# Patient Record
Sex: Female | Born: 1945 | Hispanic: No | Marital: Married | Smoking: Never smoker
Health system: Southern US, Community
[De-identification: ages and names within clinical notes are randomized; demographics above are authoritative.]

## PROBLEM LIST (undated history)

## (undated) DIAGNOSIS — M1990 Unspecified osteoarthritis, unspecified site: Secondary | ICD-10-CM

## (undated) DIAGNOSIS — Z972 Presence of dental prosthetic device (complete) (partial): Secondary | ICD-10-CM

## (undated) DIAGNOSIS — M1712 Unilateral primary osteoarthritis, left knee: Secondary | ICD-10-CM

## (undated) DIAGNOSIS — E785 Hyperlipidemia, unspecified: Secondary | ICD-10-CM

## (undated) DIAGNOSIS — Z973 Presence of spectacles and contact lenses: Secondary | ICD-10-CM

## (undated) DIAGNOSIS — K219 Gastro-esophageal reflux disease without esophagitis: Secondary | ICD-10-CM

## (undated) DIAGNOSIS — I10 Essential (primary) hypertension: Secondary | ICD-10-CM

## (undated) DIAGNOSIS — E119 Type 2 diabetes mellitus without complications: Secondary | ICD-10-CM

## (undated) HISTORY — PX: TUBAL LIGATION: SHX77

## (undated) HISTORY — PX: DILATION AND CURETTAGE OF UTERUS: SHX78

## (undated) HISTORY — PX: COLONOSCOPY: SHX174

---

## 1999-09-13 ENCOUNTER — Other Ambulatory Visit: Admission: RE | Admit: 1999-09-13 | Discharge: 1999-09-13 | Payer: Self-pay | Admitting: Obstetrics and Gynecology

## 2002-01-03 ENCOUNTER — Ambulatory Visit (HOSPITAL_COMMUNITY): Admission: RE | Admit: 2002-01-03 | Discharge: 2002-01-03 | Payer: Self-pay | Admitting: Gastroenterology

## 2002-10-10 ENCOUNTER — Other Ambulatory Visit: Admission: RE | Admit: 2002-10-10 | Discharge: 2002-10-10 | Payer: Self-pay | Admitting: Obstetrics and Gynecology

## 2003-11-15 ENCOUNTER — Other Ambulatory Visit: Admission: RE | Admit: 2003-11-15 | Discharge: 2003-11-15 | Payer: Self-pay | Admitting: Obstetrics and Gynecology

## 2004-11-29 ENCOUNTER — Other Ambulatory Visit: Admission: RE | Admit: 2004-11-29 | Discharge: 2004-11-29 | Payer: Self-pay | Admitting: Obstetrics and Gynecology

## 2005-07-02 ENCOUNTER — Encounter: Admission: RE | Admit: 2005-07-02 | Discharge: 2005-09-30 | Payer: Self-pay | Admitting: Family Medicine

## 2007-05-20 ENCOUNTER — Encounter: Admission: RE | Admit: 2007-05-20 | Discharge: 2007-06-16 | Payer: Self-pay | Admitting: Family Medicine

## 2007-06-17 ENCOUNTER — Encounter: Admission: RE | Admit: 2007-06-17 | Discharge: 2007-08-06 | Payer: Self-pay | Admitting: Family Medicine

## 2010-08-30 ENCOUNTER — Ambulatory Visit: Payer: 59 | Attending: Family Medicine | Admitting: Physical Therapy

## 2010-08-30 DIAGNOSIS — M25559 Pain in unspecified hip: Secondary | ICD-10-CM | POA: Insufficient documentation

## 2010-08-30 DIAGNOSIS — M6281 Muscle weakness (generalized): Secondary | ICD-10-CM | POA: Insufficient documentation

## 2010-08-30 DIAGNOSIS — R5381 Other malaise: Secondary | ICD-10-CM | POA: Insufficient documentation

## 2010-08-30 DIAGNOSIS — IMO0001 Reserved for inherently not codable concepts without codable children: Secondary | ICD-10-CM | POA: Insufficient documentation

## 2010-09-03 ENCOUNTER — Ambulatory Visit: Payer: 59 | Admitting: Physical Therapy

## 2010-09-05 ENCOUNTER — Ambulatory Visit: Payer: 59 | Admitting: Physical Therapy

## 2010-09-13 ENCOUNTER — Encounter: Payer: 59 | Admitting: Physical Therapy

## 2010-09-26 ENCOUNTER — Ambulatory Visit: Payer: 59 | Attending: Family Medicine | Admitting: Physical Therapy

## 2010-09-26 DIAGNOSIS — M6281 Muscle weakness (generalized): Secondary | ICD-10-CM | POA: Insufficient documentation

## 2010-09-26 DIAGNOSIS — M25559 Pain in unspecified hip: Secondary | ICD-10-CM | POA: Insufficient documentation

## 2010-09-26 DIAGNOSIS — IMO0001 Reserved for inherently not codable concepts without codable children: Secondary | ICD-10-CM | POA: Insufficient documentation

## 2010-09-26 DIAGNOSIS — R5381 Other malaise: Secondary | ICD-10-CM | POA: Insufficient documentation

## 2010-11-01 NOTE — Op Note (Signed)
[facility]  Patient:    Brooke Villa, Brooke Villa Visit Number: [id number] MRN: [id number]          Service Type: END Location: ENDO Attending Physician:  Louie Bun Dictated by:   Everardo All Madilyn Fireman, M.D. Proc. Date: 01/03/02 Admit Date:  01/03/2002 Discharge Date: 01/03/2002   CC:         Jamesetta Geralds, M.D.   Operative Report  PROCEDURE:  Colonoscopy.  INDICATIONS FOR PROCEDURE:  Intermittent rectal bleeding in a 65 year old patient with no prior colon screening.  PROCEDURE:  The patient was placed in the left lateral decubitus position and placed on the pulse monitor with continuous low-flow oxygen delivered by nasal cannula. She was sedated with 75 mg IV Demerol and 6 mg IV Versed. The Olympus video colonoscope is inserted into the rectum and advanced to the cecum, confirmed by transillumination of McBurneys point and visualization of the ileocecal valve and appendiceal orifice. The prep was excellent. The cecum, ascending, transverse, descending, and sigmoid colon all appeared normal with no masses, polyps, diverticula, or other mucosal abnormalities. The rectum, likewise, appeared normal. Retroflex view of the anus revealed small internal hemorrhoids. The colonoscope is then withdrawn and the patient returned to the recovery room in stable condition. She tolerated the procedure well and there were no immediate complications.  IMPRESSION:  Small internal hemorrhoids, otherwise, normal colonoscopy.  PLAN: 1. Treat hemorrhoids symptomatically. 2. Colon screening by sigmoidoscopy in approximately five years. Dictated by:   Everardo All Madilyn Fireman, M.D. Attending Physician:  Louie Bun DD:  01/03/02 TD:  01/06/02 Job: [id number]

## 2011-01-03 ENCOUNTER — Other Ambulatory Visit: Payer: Self-pay | Admitting: Family Medicine

## 2011-01-03 ENCOUNTER — Ambulatory Visit
Admission: RE | Admit: 2011-01-03 | Discharge: 2011-01-03 | Disposition: A | Discharge status: Home / Self Care | Payer: 59 | Source: Ambulatory Visit | Attending: Family Medicine | Admitting: Family Medicine

## 2011-01-03 DIAGNOSIS — R609 Edema, unspecified: Secondary | ICD-10-CM

## 2011-01-03 IMAGING — US US EXTREM LOW VENOUS*R*
1 series · 14 of 24 positions shown · non-contrast
Comparison: None

CLINICAL DATA: Right leg pain and swelling.

RIGHT LOWER EXTREMITY VENOUS DUPLEX ULTRASOUND
TECHNIQUE: Gray-scale sonography with graded compression, as well
as color Doppler and duplex ultrasound, were performed to evaluate
the deep venous system of the lower extremity from the level of the
common femoral vein through the popliteal and proximal calf veins.
Spectral Doppler was utilized to evaluate flow at rest and with
distal augmentation maneuvers.

[Series 1: us extrem low venous*right* · 14 of 24 slices shown]
[im 1/24]
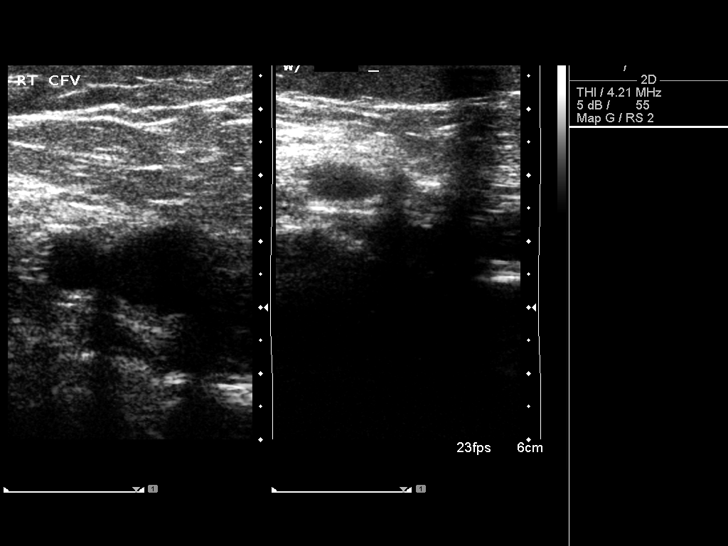
[im 3/24]
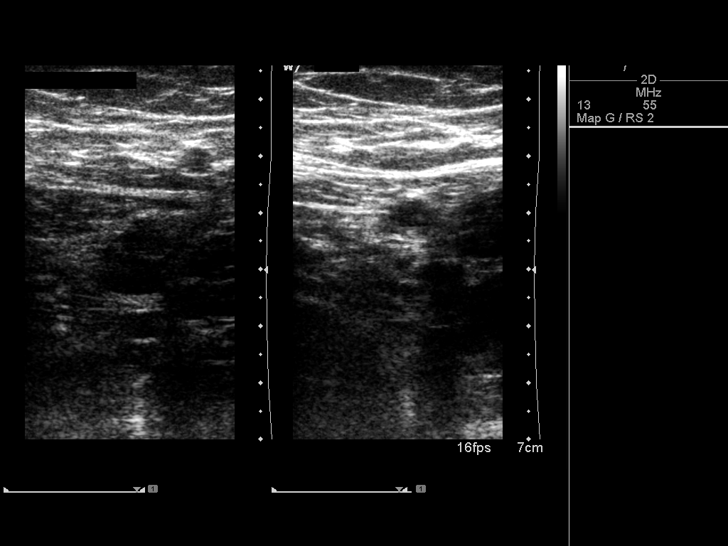
[im 5/24]
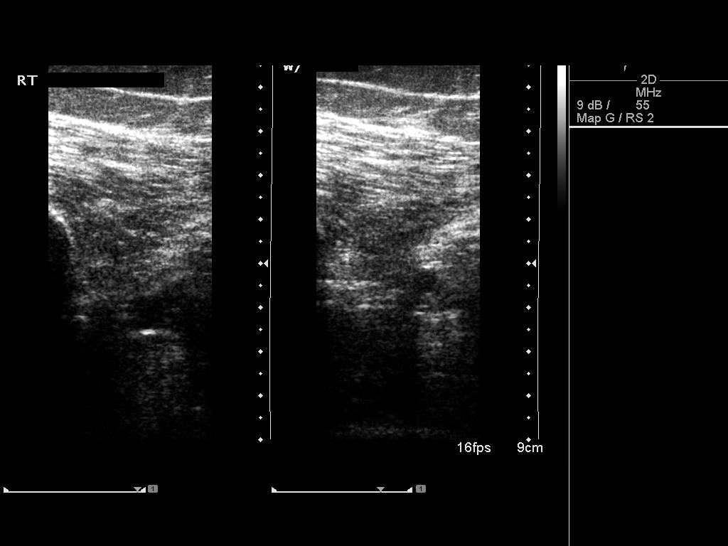
[im 7/24]
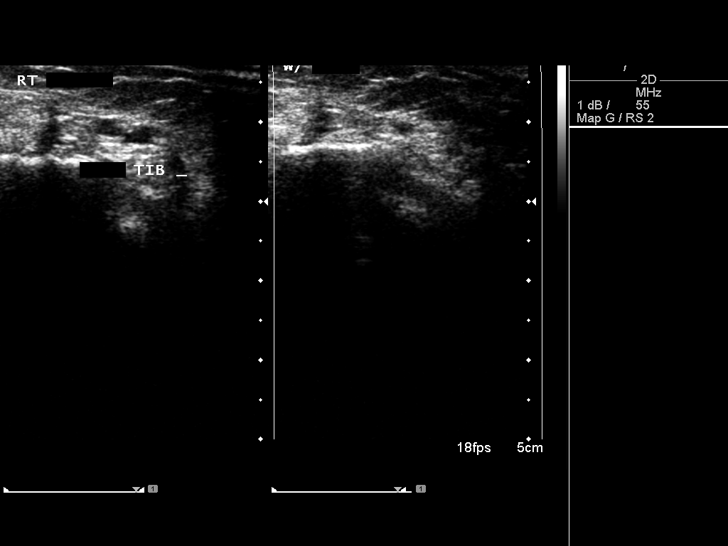
[im 8/24]
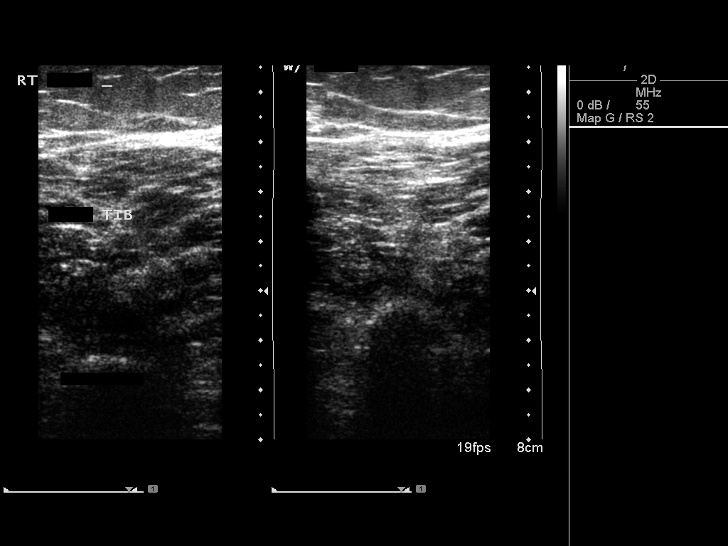
[im 10/24]
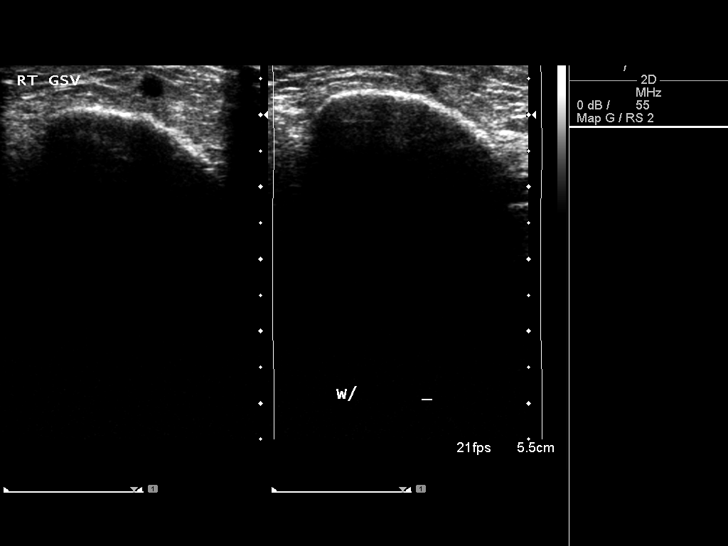
[im 12/24]
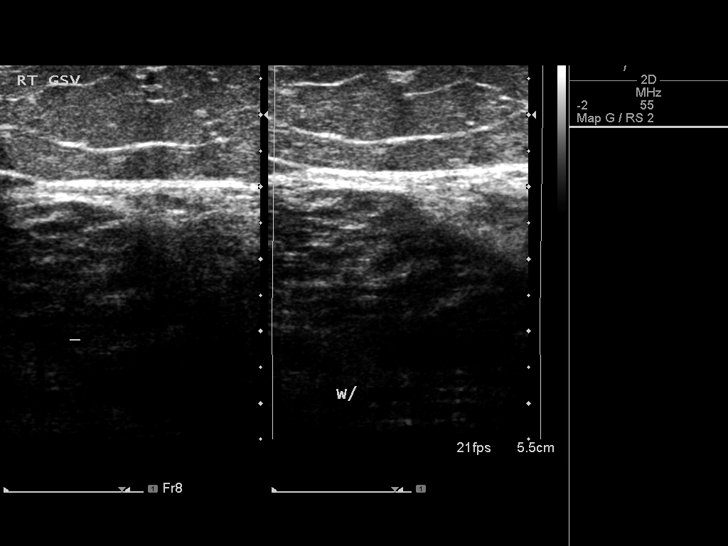
[im 13/24]
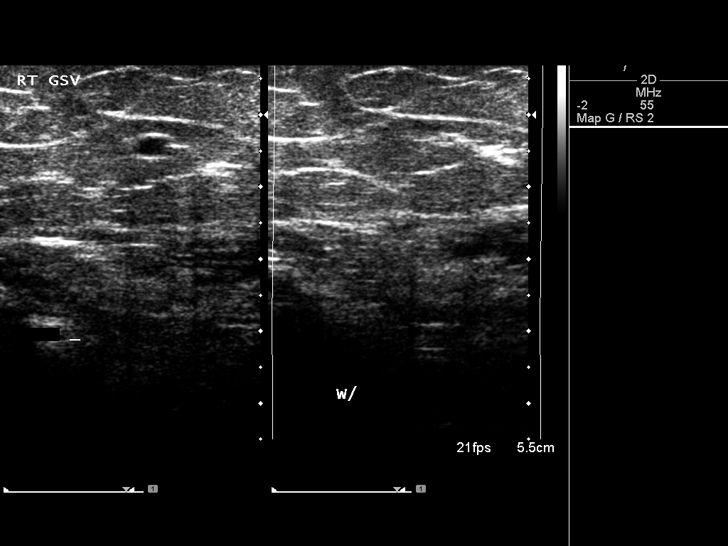
[im 15/24]
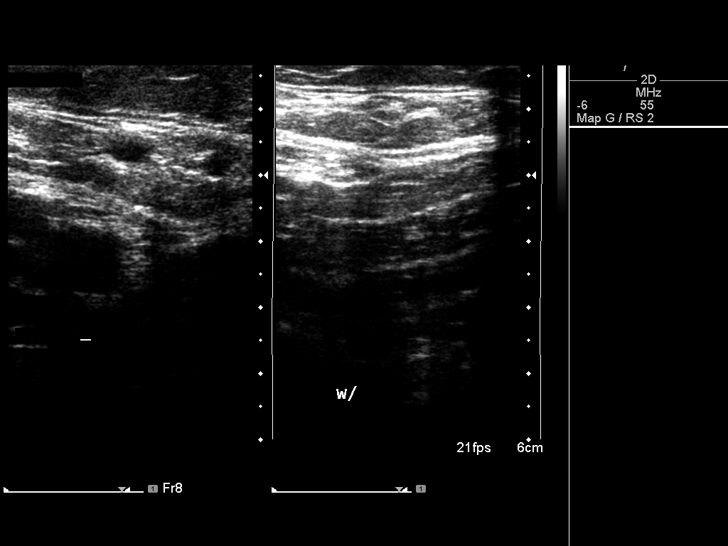
[im 17/24]
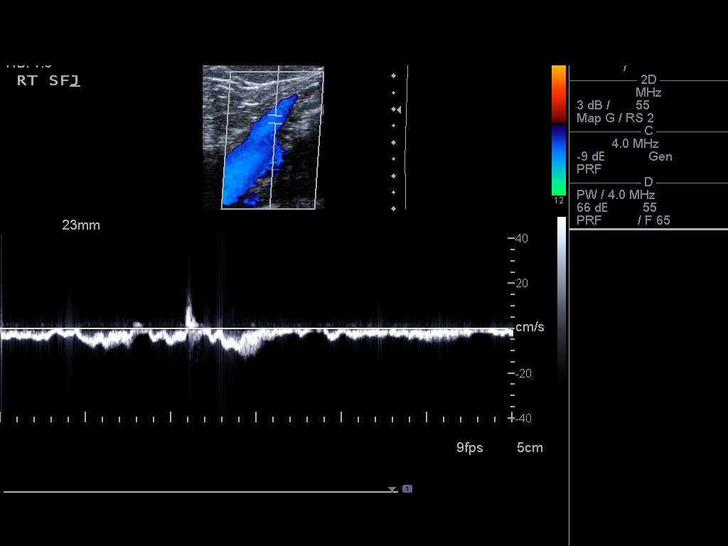
[im 19/24]
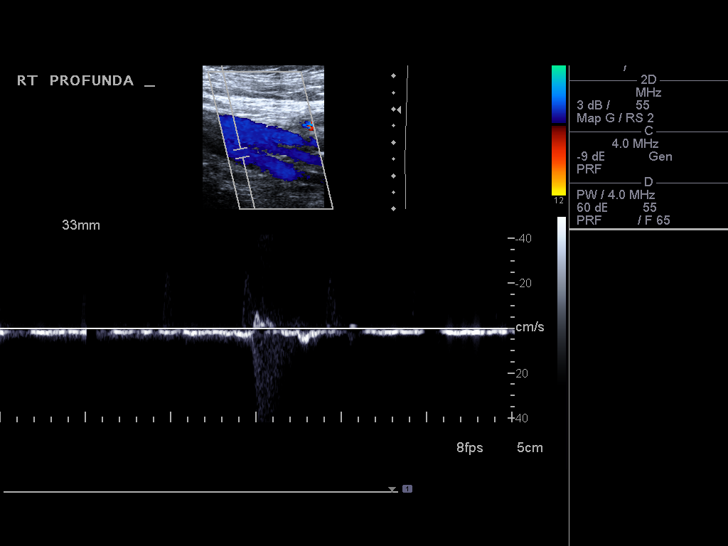
[im 20/24]
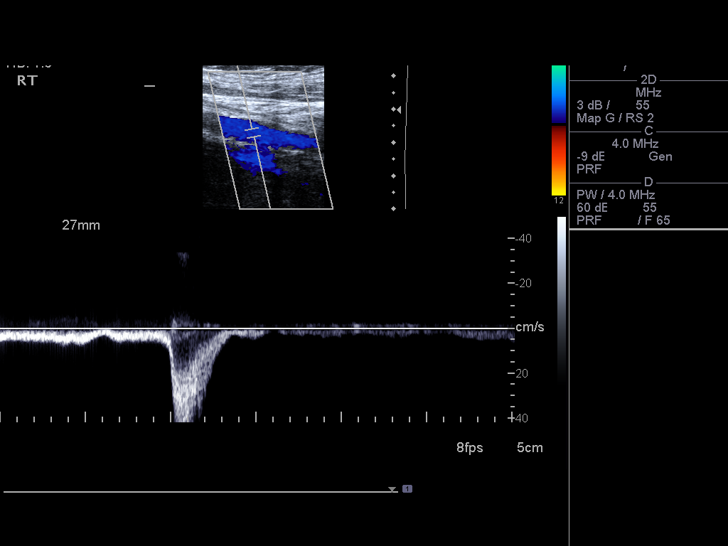
[im 22/24]
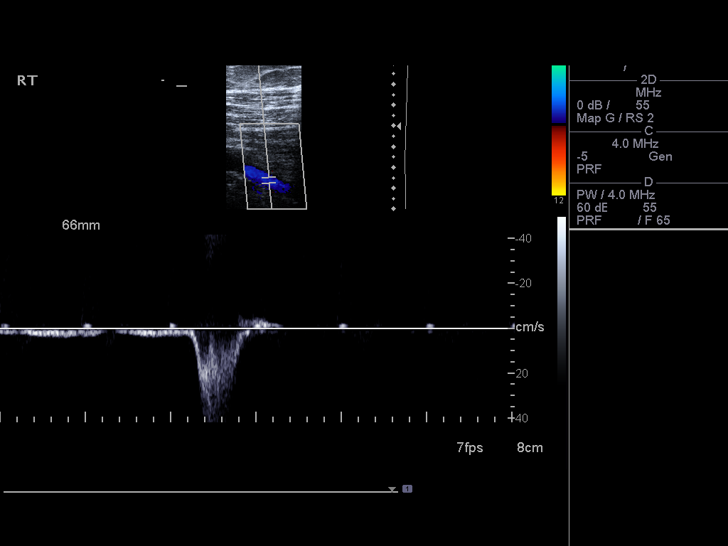
[im 24/24]
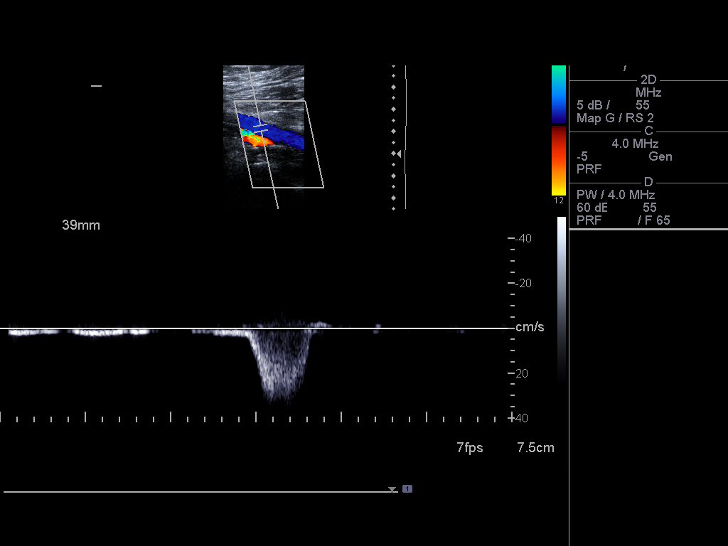

[14 of 24 positions shown; findings below may reference images not displayed]

FINDINGS: There is patent flow in the common femoral, profunda
femoral, superficial femoral and popliteal veins.  These vessels
were completely compressible and demonstrate increased flow with
augmentation.

No findings for superficial thrombophlebitis.
IMPRESSION: Negative venous Doppler examination for deep venous thrombosis in
the right lower extremity.

## 2011-06-25 DIAGNOSIS — M1710 Unilateral primary osteoarthritis, unspecified knee: Secondary | ICD-10-CM | POA: Diagnosis not present

## 2011-06-26 DIAGNOSIS — R262 Difficulty in walking, not elsewhere classified: Secondary | ICD-10-CM | POA: Diagnosis not present

## 2011-06-26 DIAGNOSIS — M6281 Muscle weakness (generalized): Secondary | ICD-10-CM | POA: Diagnosis not present

## 2011-06-26 DIAGNOSIS — R300 Dysuria: Secondary | ICD-10-CM | POA: Diagnosis not present

## 2011-06-26 DIAGNOSIS — M25569 Pain in unspecified knee: Secondary | ICD-10-CM | POA: Diagnosis not present

## 2011-06-27 DIAGNOSIS — M25569 Pain in unspecified knee: Secondary | ICD-10-CM | POA: Diagnosis not present

## 2011-06-27 DIAGNOSIS — M6281 Muscle weakness (generalized): Secondary | ICD-10-CM | POA: Diagnosis not present

## 2011-06-27 DIAGNOSIS — R262 Difficulty in walking, not elsewhere classified: Secondary | ICD-10-CM | POA: Diagnosis not present

## 2011-07-18 DIAGNOSIS — E119 Type 2 diabetes mellitus without complications: Secondary | ICD-10-CM | POA: Diagnosis not present

## 2011-07-18 DIAGNOSIS — I10 Essential (primary) hypertension: Secondary | ICD-10-CM | POA: Diagnosis not present

## 2011-07-18 DIAGNOSIS — M76899 Other specified enthesopathies of unspecified lower limb, excluding foot: Secondary | ICD-10-CM | POA: Diagnosis not present

## 2011-07-18 DIAGNOSIS — M109 Gout, unspecified: Secondary | ICD-10-CM | POA: Diagnosis not present

## 2011-07-18 DIAGNOSIS — R262 Difficulty in walking, not elsewhere classified: Secondary | ICD-10-CM | POA: Diagnosis not present

## 2011-07-18 DIAGNOSIS — Z8719 Personal history of other diseases of the digestive system: Secondary | ICD-10-CM | POA: Diagnosis not present

## 2011-07-18 DIAGNOSIS — M6281 Muscle weakness (generalized): Secondary | ICD-10-CM | POA: Diagnosis not present

## 2011-07-18 DIAGNOSIS — E782 Mixed hyperlipidemia: Secondary | ICD-10-CM | POA: Diagnosis not present

## 2011-07-18 DIAGNOSIS — R252 Cramp and spasm: Secondary | ICD-10-CM | POA: Diagnosis not present

## 2011-07-18 DIAGNOSIS — M25569 Pain in unspecified knee: Secondary | ICD-10-CM | POA: Diagnosis not present

## 2011-07-21 DIAGNOSIS — M545 Low back pain, unspecified: Secondary | ICD-10-CM | POA: Diagnosis not present

## 2011-07-21 DIAGNOSIS — IMO0002 Reserved for concepts with insufficient information to code with codable children: Secondary | ICD-10-CM | POA: Diagnosis not present

## 2011-09-03 DIAGNOSIS — IMO0002 Reserved for concepts with insufficient information to code with codable children: Secondary | ICD-10-CM | POA: Diagnosis not present

## 2011-09-03 DIAGNOSIS — M545 Low back pain, unspecified: Secondary | ICD-10-CM | POA: Diagnosis not present

## 2011-10-15 DIAGNOSIS — IMO0002 Reserved for concepts with insufficient information to code with codable children: Secondary | ICD-10-CM | POA: Diagnosis not present

## 2012-01-09 DIAGNOSIS — Z1211 Encounter for screening for malignant neoplasm of colon: Secondary | ICD-10-CM | POA: Diagnosis not present

## 2012-01-13 DIAGNOSIS — Z8719 Personal history of other diseases of the digestive system: Secondary | ICD-10-CM | POA: Diagnosis not present

## 2012-01-13 DIAGNOSIS — I10 Essential (primary) hypertension: Secondary | ICD-10-CM | POA: Diagnosis not present

## 2012-01-13 DIAGNOSIS — E782 Mixed hyperlipidemia: Secondary | ICD-10-CM | POA: Diagnosis not present

## 2012-01-13 DIAGNOSIS — E119 Type 2 diabetes mellitus without complications: Secondary | ICD-10-CM | POA: Diagnosis not present

## 2012-01-13 DIAGNOSIS — R252 Cramp and spasm: Secondary | ICD-10-CM | POA: Diagnosis not present

## 2012-01-13 DIAGNOSIS — M109 Gout, unspecified: Secondary | ICD-10-CM | POA: Diagnosis not present

## 2012-01-16 DIAGNOSIS — Z780 Asymptomatic menopausal state: Secondary | ICD-10-CM | POA: Diagnosis not present

## 2012-01-16 DIAGNOSIS — E119 Type 2 diabetes mellitus without complications: Secondary | ICD-10-CM | POA: Diagnosis not present

## 2012-01-16 DIAGNOSIS — E782 Mixed hyperlipidemia: Secondary | ICD-10-CM | POA: Diagnosis not present

## 2012-01-16 DIAGNOSIS — M109 Gout, unspecified: Secondary | ICD-10-CM | POA: Diagnosis not present

## 2012-01-16 DIAGNOSIS — I10 Essential (primary) hypertension: Secondary | ICD-10-CM | POA: Diagnosis not present

## 2012-01-16 DIAGNOSIS — Z8719 Personal history of other diseases of the digestive system: Secondary | ICD-10-CM | POA: Diagnosis not present

## 2012-01-16 DIAGNOSIS — R609 Edema, unspecified: Secondary | ICD-10-CM | POA: Diagnosis not present

## 2012-01-16 DIAGNOSIS — M25569 Pain in unspecified knee: Secondary | ICD-10-CM | POA: Diagnosis not present

## 2012-01-23 DIAGNOSIS — H538 Other visual disturbances: Secondary | ICD-10-CM | POA: Diagnosis not present

## 2012-01-23 DIAGNOSIS — H2510 Age-related nuclear cataract, unspecified eye: Secondary | ICD-10-CM | POA: Diagnosis not present

## 2012-01-23 DIAGNOSIS — H35379 Puckering of macula, unspecified eye: Secondary | ICD-10-CM | POA: Diagnosis not present

## 2012-01-23 DIAGNOSIS — H524 Presbyopia: Secondary | ICD-10-CM | POA: Diagnosis not present

## 2012-01-23 DIAGNOSIS — H43819 Vitreous degeneration, unspecified eye: Secondary | ICD-10-CM | POA: Diagnosis not present

## 2012-03-26 DIAGNOSIS — Z1231 Encounter for screening mammogram for malignant neoplasm of breast: Secondary | ICD-10-CM | POA: Diagnosis not present

## 2012-03-26 DIAGNOSIS — Z1289 Encounter for screening for malignant neoplasm of other sites: Secondary | ICD-10-CM | POA: Diagnosis not present

## 2012-03-26 DIAGNOSIS — N760 Acute vaginitis: Secondary | ICD-10-CM | POA: Diagnosis not present

## 2012-05-20 DIAGNOSIS — Z23 Encounter for immunization: Secondary | ICD-10-CM | POA: Diagnosis not present

## 2012-07-19 DIAGNOSIS — I10 Essential (primary) hypertension: Secondary | ICD-10-CM | POA: Diagnosis not present

## 2012-07-19 DIAGNOSIS — R252 Cramp and spasm: Secondary | ICD-10-CM | POA: Diagnosis not present

## 2012-07-19 DIAGNOSIS — E782 Mixed hyperlipidemia: Secondary | ICD-10-CM | POA: Diagnosis not present

## 2012-07-19 DIAGNOSIS — M109 Gout, unspecified: Secondary | ICD-10-CM | POA: Diagnosis not present

## 2012-07-19 DIAGNOSIS — M25569 Pain in unspecified knee: Secondary | ICD-10-CM | POA: Diagnosis not present

## 2012-07-19 DIAGNOSIS — E119 Type 2 diabetes mellitus without complications: Secondary | ICD-10-CM | POA: Diagnosis not present

## 2012-07-19 DIAGNOSIS — Z780 Asymptomatic menopausal state: Secondary | ICD-10-CM | POA: Diagnosis not present

## 2012-07-19 DIAGNOSIS — R609 Edema, unspecified: Secondary | ICD-10-CM | POA: Diagnosis not present

## 2012-07-23 DIAGNOSIS — I10 Essential (primary) hypertension: Secondary | ICD-10-CM | POA: Diagnosis not present

## 2012-07-23 DIAGNOSIS — H5710 Ocular pain, unspecified eye: Secondary | ICD-10-CM | POA: Diagnosis not present

## 2012-07-23 DIAGNOSIS — M109 Gout, unspecified: Secondary | ICD-10-CM | POA: Diagnosis not present

## 2012-07-23 DIAGNOSIS — IMO0001 Reserved for inherently not codable concepts without codable children: Secondary | ICD-10-CM | POA: Diagnosis not present

## 2012-07-23 DIAGNOSIS — E782 Mixed hyperlipidemia: Secondary | ICD-10-CM | POA: Diagnosis not present

## 2012-07-23 DIAGNOSIS — H04129 Dry eye syndrome of unspecified lacrimal gland: Secondary | ICD-10-CM | POA: Diagnosis not present

## 2012-07-23 DIAGNOSIS — R809 Proteinuria, unspecified: Secondary | ICD-10-CM | POA: Diagnosis not present

## 2012-07-23 DIAGNOSIS — Z1331 Encounter for screening for depression: Secondary | ICD-10-CM | POA: Diagnosis not present

## 2012-07-23 DIAGNOSIS — R609 Edema, unspecified: Secondary | ICD-10-CM | POA: Diagnosis not present

## 2012-08-06 DIAGNOSIS — H5710 Ocular pain, unspecified eye: Secondary | ICD-10-CM | POA: Diagnosis not present

## 2012-08-06 DIAGNOSIS — H04129 Dry eye syndrome of unspecified lacrimal gland: Secondary | ICD-10-CM | POA: Diagnosis not present

## 2012-09-03 DIAGNOSIS — H5710 Ocular pain, unspecified eye: Secondary | ICD-10-CM | POA: Diagnosis not present

## 2012-09-03 DIAGNOSIS — H04129 Dry eye syndrome of unspecified lacrimal gland: Secondary | ICD-10-CM | POA: Diagnosis not present

## 2013-01-17 DIAGNOSIS — IMO0001 Reserved for inherently not codable concepts without codable children: Secondary | ICD-10-CM | POA: Diagnosis not present

## 2013-01-17 DIAGNOSIS — I10 Essential (primary) hypertension: Secondary | ICD-10-CM | POA: Diagnosis not present

## 2013-01-17 DIAGNOSIS — E782 Mixed hyperlipidemia: Secondary | ICD-10-CM | POA: Diagnosis not present

## 2013-01-17 DIAGNOSIS — R609 Edema, unspecified: Secondary | ICD-10-CM | POA: Diagnosis not present

## 2013-01-17 DIAGNOSIS — H5710 Ocular pain, unspecified eye: Secondary | ICD-10-CM | POA: Diagnosis not present

## 2013-01-17 DIAGNOSIS — R809 Proteinuria, unspecified: Secondary | ICD-10-CM | POA: Diagnosis not present

## 2013-01-17 DIAGNOSIS — M109 Gout, unspecified: Secondary | ICD-10-CM | POA: Diagnosis not present

## 2013-03-04 DIAGNOSIS — H25049 Posterior subcapsular polar age-related cataract, unspecified eye: Secondary | ICD-10-CM | POA: Diagnosis not present

## 2013-03-04 DIAGNOSIS — E119 Type 2 diabetes mellitus without complications: Secondary | ICD-10-CM | POA: Diagnosis not present

## 2013-03-04 DIAGNOSIS — Z23 Encounter for immunization: Secondary | ICD-10-CM | POA: Diagnosis not present

## 2013-03-04 DIAGNOSIS — M109 Gout, unspecified: Secondary | ICD-10-CM | POA: Diagnosis not present

## 2013-03-04 DIAGNOSIS — H04129 Dry eye syndrome of unspecified lacrimal gland: Secondary | ICD-10-CM | POA: Diagnosis not present

## 2013-03-04 DIAGNOSIS — G609 Hereditary and idiopathic neuropathy, unspecified: Secondary | ICD-10-CM | POA: Diagnosis not present

## 2013-03-04 DIAGNOSIS — E782 Mixed hyperlipidemia: Secondary | ICD-10-CM | POA: Diagnosis not present

## 2013-03-04 DIAGNOSIS — H538 Other visual disturbances: Secondary | ICD-10-CM | POA: Diagnosis not present

## 2013-03-04 DIAGNOSIS — M25569 Pain in unspecified knee: Secondary | ICD-10-CM | POA: Diagnosis not present

## 2013-03-04 DIAGNOSIS — H524 Presbyopia: Secondary | ICD-10-CM | POA: Diagnosis not present

## 2013-03-04 DIAGNOSIS — I10 Essential (primary) hypertension: Secondary | ICD-10-CM | POA: Diagnosis not present

## 2013-03-04 DIAGNOSIS — K219 Gastro-esophageal reflux disease without esophagitis: Secondary | ICD-10-CM | POA: Diagnosis not present

## 2013-03-14 DIAGNOSIS — M1710 Unilateral primary osteoarthritis, unspecified knee: Secondary | ICD-10-CM | POA: Diagnosis not present

## 2013-03-14 DIAGNOSIS — M76899 Other specified enthesopathies of unspecified lower limb, excluding foot: Secondary | ICD-10-CM | POA: Diagnosis not present

## 2013-04-04 ENCOUNTER — Other Ambulatory Visit: Payer: Self-pay

## 2013-04-04 DIAGNOSIS — Z1231 Encounter for screening mammogram for malignant neoplasm of breast: Secondary | ICD-10-CM

## 2013-04-06 DIAGNOSIS — M1710 Unilateral primary osteoarthritis, unspecified knee: Secondary | ICD-10-CM | POA: Diagnosis not present

## 2013-04-08 ENCOUNTER — Ambulatory Visit
Admission: RE | Admit: 2013-04-08 | Discharge: 2013-04-08 | Disposition: A | Discharge status: Home / Self Care | Payer: Medicare Other | Source: Ambulatory Visit

## 2013-04-08 DIAGNOSIS — Z1231 Encounter for screening mammogram for malignant neoplasm of breast: Secondary | ICD-10-CM

## 2013-04-21 DIAGNOSIS — N951 Menopausal and female climacteric states: Secondary | ICD-10-CM | POA: Diagnosis not present

## 2013-04-21 DIAGNOSIS — Z01419 Encounter for gynecological examination (general) (routine) without abnormal findings: Secondary | ICD-10-CM | POA: Diagnosis not present

## 2013-05-06 ENCOUNTER — Ambulatory Visit: Payer: 59

## 2013-05-09 DIAGNOSIS — M1710 Unilateral primary osteoarthritis, unspecified knee: Secondary | ICD-10-CM | POA: Diagnosis not present

## 2013-05-09 DIAGNOSIS — IMO0002 Reserved for concepts with insufficient information to code with codable children: Secondary | ICD-10-CM | POA: Diagnosis not present

## 2013-07-12 DIAGNOSIS — R059 Cough, unspecified: Secondary | ICD-10-CM | POA: Diagnosis not present

## 2013-07-12 DIAGNOSIS — R05 Cough: Secondary | ICD-10-CM | POA: Diagnosis not present

## 2013-08-12 DIAGNOSIS — R05 Cough: Secondary | ICD-10-CM | POA: Diagnosis not present

## 2013-08-12 DIAGNOSIS — K219 Gastro-esophageal reflux disease without esophagitis: Secondary | ICD-10-CM | POA: Diagnosis not present

## 2013-08-12 DIAGNOSIS — R059 Cough, unspecified: Secondary | ICD-10-CM | POA: Diagnosis not present

## 2013-08-25 ENCOUNTER — Other Ambulatory Visit: Payer: Self-pay | Admitting: Orthopedic Surgery

## 2013-08-26 DIAGNOSIS — Z1331 Encounter for screening for depression: Secondary | ICD-10-CM | POA: Diagnosis not present

## 2013-08-26 DIAGNOSIS — M109 Gout, unspecified: Secondary | ICD-10-CM | POA: Diagnosis not present

## 2013-08-26 DIAGNOSIS — I10 Essential (primary) hypertension: Secondary | ICD-10-CM | POA: Diagnosis not present

## 2013-08-26 DIAGNOSIS — IMO0001 Reserved for inherently not codable concepts without codable children: Secondary | ICD-10-CM | POA: Diagnosis not present

## 2013-08-26 DIAGNOSIS — R609 Edema, unspecified: Secondary | ICD-10-CM | POA: Diagnosis not present

## 2013-08-26 DIAGNOSIS — E782 Mixed hyperlipidemia: Secondary | ICD-10-CM | POA: Diagnosis not present

## 2013-08-26 DIAGNOSIS — K219 Gastro-esophageal reflux disease without esophagitis: Secondary | ICD-10-CM | POA: Diagnosis not present

## 2013-08-26 DIAGNOSIS — M25569 Pain in unspecified knee: Secondary | ICD-10-CM | POA: Diagnosis not present

## 2013-09-20 ENCOUNTER — Inpatient Hospital Stay: Admit: 2013-09-20 | Payer: Self-pay | Admitting: Orthopedic Surgery

## 2013-09-20 SURGERY — ARTHROPLASTY, KNEE, UNICOMPARTMENTAL
Anesthesia: General | Site: Knee | Laterality: Right

## 2013-10-28 DIAGNOSIS — M25569 Pain in unspecified knee: Secondary | ICD-10-CM | POA: Diagnosis not present

## 2013-10-28 DIAGNOSIS — M1710 Unilateral primary osteoarthritis, unspecified knee: Secondary | ICD-10-CM | POA: Diagnosis not present

## 2013-11-11 DIAGNOSIS — M47817 Spondylosis without myelopathy or radiculopathy, lumbosacral region: Secondary | ICD-10-CM | POA: Diagnosis not present

## 2013-11-11 DIAGNOSIS — M1710 Unilateral primary osteoarthritis, unspecified knee: Secondary | ICD-10-CM | POA: Diagnosis not present

## 2013-11-25 DIAGNOSIS — M25569 Pain in unspecified knee: Secondary | ICD-10-CM | POA: Diagnosis not present

## 2013-11-25 DIAGNOSIS — M1710 Unilateral primary osteoarthritis, unspecified knee: Secondary | ICD-10-CM | POA: Diagnosis not present

## 2013-12-04 DIAGNOSIS — M1710 Unilateral primary osteoarthritis, unspecified knee: Secondary | ICD-10-CM | POA: Diagnosis not present

## 2013-12-05 DIAGNOSIS — IMO0002 Reserved for concepts with insufficient information to code with codable children: Secondary | ICD-10-CM | POA: Diagnosis not present

## 2013-12-05 DIAGNOSIS — M1710 Unilateral primary osteoarthritis, unspecified knee: Secondary | ICD-10-CM | POA: Diagnosis not present

## 2013-12-29 DIAGNOSIS — M1710 Unilateral primary osteoarthritis, unspecified knee: Secondary | ICD-10-CM | POA: Diagnosis not present

## 2014-01-31 DIAGNOSIS — M1710 Unilateral primary osteoarthritis, unspecified knee: Secondary | ICD-10-CM | POA: Diagnosis not present

## 2014-02-09 ENCOUNTER — Other Ambulatory Visit: Payer: Self-pay | Admitting: Orthopedic Surgery

## 2014-03-03 DIAGNOSIS — H538 Other visual disturbances: Secondary | ICD-10-CM | POA: Diagnosis not present

## 2014-03-03 DIAGNOSIS — E119 Type 2 diabetes mellitus without complications: Secondary | ICD-10-CM | POA: Diagnosis not present

## 2014-03-03 DIAGNOSIS — R609 Edema, unspecified: Secondary | ICD-10-CM | POA: Diagnosis not present

## 2014-03-03 DIAGNOSIS — Z1331 Encounter for screening for depression: Secondary | ICD-10-CM | POA: Diagnosis not present

## 2014-03-03 DIAGNOSIS — H04129 Dry eye syndrome of unspecified lacrimal gland: Secondary | ICD-10-CM | POA: Diagnosis not present

## 2014-03-03 DIAGNOSIS — Z23 Encounter for immunization: Secondary | ICD-10-CM | POA: Diagnosis not present

## 2014-03-03 DIAGNOSIS — M25569 Pain in unspecified knee: Secondary | ICD-10-CM | POA: Diagnosis not present

## 2014-03-03 DIAGNOSIS — IMO0001 Reserved for inherently not codable concepts without codable children: Secondary | ICD-10-CM | POA: Diagnosis not present

## 2014-03-03 DIAGNOSIS — I10 Essential (primary) hypertension: Secondary | ICD-10-CM | POA: Diagnosis not present

## 2014-03-03 DIAGNOSIS — E782 Mixed hyperlipidemia: Secondary | ICD-10-CM | POA: Diagnosis not present

## 2014-03-03 DIAGNOSIS — M109 Gout, unspecified: Secondary | ICD-10-CM | POA: Diagnosis not present

## 2014-03-03 DIAGNOSIS — H52229 Regular astigmatism, unspecified eye: Secondary | ICD-10-CM | POA: Diagnosis not present

## 2014-03-10 DIAGNOSIS — M109 Gout, unspecified: Secondary | ICD-10-CM | POA: Diagnosis not present

## 2014-03-10 DIAGNOSIS — I10 Essential (primary) hypertension: Secondary | ICD-10-CM | POA: Diagnosis not present

## 2014-03-10 DIAGNOSIS — M25569 Pain in unspecified knee: Secondary | ICD-10-CM | POA: Diagnosis not present

## 2014-03-10 DIAGNOSIS — Z23 Encounter for immunization: Secondary | ICD-10-CM | POA: Diagnosis not present

## 2014-03-10 DIAGNOSIS — K219 Gastro-esophageal reflux disease without esophagitis: Secondary | ICD-10-CM | POA: Diagnosis not present

## 2014-03-10 DIAGNOSIS — E119 Type 2 diabetes mellitus without complications: Secondary | ICD-10-CM | POA: Diagnosis not present

## 2014-03-10 DIAGNOSIS — R609 Edema, unspecified: Secondary | ICD-10-CM | POA: Diagnosis not present

## 2014-03-10 DIAGNOSIS — E782 Mixed hyperlipidemia: Secondary | ICD-10-CM | POA: Diagnosis not present

## 2014-04-03 ENCOUNTER — Other Ambulatory Visit: Payer: Self-pay

## 2014-04-03 DIAGNOSIS — Z1231 Encounter for screening mammogram for malignant neoplasm of breast: Secondary | ICD-10-CM

## 2014-04-11 ENCOUNTER — Encounter (HOSPITAL_BASED_OUTPATIENT_CLINIC_OR_DEPARTMENT_OTHER): Payer: Self-pay | Admitting: *Deleted

## 2014-04-11 NOTE — Progress Notes (Signed)
Out of town-flying in Thursday 2pm-will come here for ekg-bmet-bring overnight bag,all meds and arrive 6am

## 2014-04-13 ENCOUNTER — Other Ambulatory Visit: Payer: Self-pay

## 2014-04-13 ENCOUNTER — Encounter (HOSPITAL_BASED_OUTPATIENT_CLINIC_OR_DEPARTMENT_OTHER)
Admission: RE | Admit: 2014-04-13 | Discharge: 2014-04-13 | Disposition: A | Discharge status: Home / Self Care | Payer: Medicare Other | Source: Ambulatory Visit | Attending: Orthopedic Surgery | Admitting: Orthopedic Surgery

## 2014-04-13 DIAGNOSIS — M179 Osteoarthritis of knee, unspecified: Secondary | ICD-10-CM | POA: Diagnosis not present

## 2014-04-13 DIAGNOSIS — K219 Gastro-esophageal reflux disease without esophagitis: Secondary | ICD-10-CM | POA: Diagnosis not present

## 2014-04-13 DIAGNOSIS — E119 Type 2 diabetes mellitus without complications: Secondary | ICD-10-CM | POA: Diagnosis not present

## 2014-04-13 DIAGNOSIS — Z79899 Other long term (current) drug therapy: Secondary | ICD-10-CM | POA: Diagnosis not present

## 2014-04-13 DIAGNOSIS — I10 Essential (primary) hypertension: Secondary | ICD-10-CM | POA: Diagnosis not present

## 2014-04-13 DIAGNOSIS — E785 Hyperlipidemia, unspecified: Secondary | ICD-10-CM | POA: Diagnosis not present

## 2014-04-13 LAB — BASIC METABOLIC PANEL
Anion gap: 12 (ref 5–15)
BUN: 12 mg/dL (ref 6–23)
CALCIUM: 10 mg/dL (ref 8.4–10.5)
CO2: 28 meq/L (ref 19–32)
CREATININE: 0.84 mg/dL (ref 0.50–1.10)
Chloride: 102 mEq/L (ref 96–112)
GFR calc non Af Amer: 70 mL/min — ABNORMAL LOW (ref >90)
GFR, EST AFRICAN AMERICAN: 81 mL/min — AB (ref >90)
Glucose, Bld: 93 mg/dL (ref 70–99)
Potassium: 4.2 mEq/L (ref 3.7–5.3)
Sodium: 142 mEq/L (ref 137–147)

## 2014-04-14 ENCOUNTER — Encounter (HOSPITAL_BASED_OUTPATIENT_CLINIC_OR_DEPARTMENT_OTHER): Payer: Medicare Other | Admitting: Certified Registered"

## 2014-04-14 ENCOUNTER — Encounter (HOSPITAL_BASED_OUTPATIENT_CLINIC_OR_DEPARTMENT_OTHER)
Admission: RE | Disposition: A | Discharge status: Home / Self Care | Payer: Self-pay | Source: Ambulatory Visit | Attending: Orthopedic Surgery

## 2014-04-14 ENCOUNTER — Ambulatory Visit (HOSPITAL_COMMUNITY): Payer: Medicare Other

## 2014-04-14 ENCOUNTER — Ambulatory Visit (HOSPITAL_BASED_OUTPATIENT_CLINIC_OR_DEPARTMENT_OTHER): Payer: Medicare Other | Admitting: Certified Registered"

## 2014-04-14 ENCOUNTER — Encounter (HOSPITAL_BASED_OUTPATIENT_CLINIC_OR_DEPARTMENT_OTHER): Payer: Self-pay | Admitting: Certified Registered"

## 2014-04-14 ENCOUNTER — Ambulatory Visit (HOSPITAL_BASED_OUTPATIENT_CLINIC_OR_DEPARTMENT_OTHER)
Admission: RE | Admit: 2014-04-14 | Discharge: 2014-04-15 | Disposition: A | Discharge status: Home / Self Care | Payer: Medicare Other | Source: Ambulatory Visit | Attending: Orthopedic Surgery | Admitting: Orthopedic Surgery

## 2014-04-14 DIAGNOSIS — K219 Gastro-esophageal reflux disease without esophagitis: Secondary | ICD-10-CM | POA: Insufficient documentation

## 2014-04-14 DIAGNOSIS — Z96652 Presence of left artificial knee joint: Secondary | ICD-10-CM | POA: Diagnosis not present

## 2014-04-14 DIAGNOSIS — M25562 Pain in left knee: Secondary | ICD-10-CM | POA: Diagnosis not present

## 2014-04-14 DIAGNOSIS — E785 Hyperlipidemia, unspecified: Secondary | ICD-10-CM | POA: Insufficient documentation

## 2014-04-14 DIAGNOSIS — I10 Essential (primary) hypertension: Secondary | ICD-10-CM | POA: Diagnosis not present

## 2014-04-14 DIAGNOSIS — M1712 Unilateral primary osteoarthritis, left knee: Secondary | ICD-10-CM | POA: Diagnosis not present

## 2014-04-14 DIAGNOSIS — M179 Osteoarthritis of knee, unspecified: Secondary | ICD-10-CM | POA: Insufficient documentation

## 2014-04-14 DIAGNOSIS — E119 Type 2 diabetes mellitus without complications: Secondary | ICD-10-CM | POA: Diagnosis not present

## 2014-04-14 DIAGNOSIS — G8918 Other acute postprocedural pain: Secondary | ICD-10-CM | POA: Diagnosis not present

## 2014-04-14 DIAGNOSIS — Z79899 Other long term (current) drug therapy: Secondary | ICD-10-CM | POA: Diagnosis not present

## 2014-04-14 HISTORY — DX: Gastro-esophageal reflux disease without esophagitis: K21.9

## 2014-04-14 HISTORY — DX: Essential (primary) hypertension: I10

## 2014-04-14 HISTORY — DX: Type 2 diabetes mellitus without complications: E11.9

## 2014-04-14 HISTORY — DX: Unilateral primary osteoarthritis, left knee: M17.12

## 2014-04-14 HISTORY — DX: Presence of spectacles and contact lenses: Z97.3

## 2014-04-14 HISTORY — DX: Presence of dental prosthetic device (complete) (partial): Z97.2

## 2014-04-14 HISTORY — PX: PARTIAL KNEE ARTHROPLASTY: SHX2174

## 2014-04-14 HISTORY — DX: Hyperlipidemia, unspecified: E78.5

## 2014-04-14 HISTORY — DX: Unspecified osteoarthritis, unspecified site: M19.90

## 2014-04-14 LAB — POCT HEMOGLOBIN-HEMACUE: Hemoglobin: 12.6 g/dL (ref 12.0–15.0)

## 2014-04-14 LAB — GLUCOSE, CAPILLARY
GLUCOSE-CAPILLARY: 117 mg/dL — AB (ref 70–99)
Glucose-Capillary: 119 mg/dL — ABNORMAL HIGH (ref 70–99)

## 2014-04-14 IMAGING — CR DG KNEE 1-2V PORT*L*
2 series · 2 of 2 positions shown · non-contrast
Comparison: None.

CLINICAL DATA: Status post left unicompartmental knee replacement.

EXAM:
PORTABLE LEFT KNEE - 1-2 VIEW

[ap/obl knee]
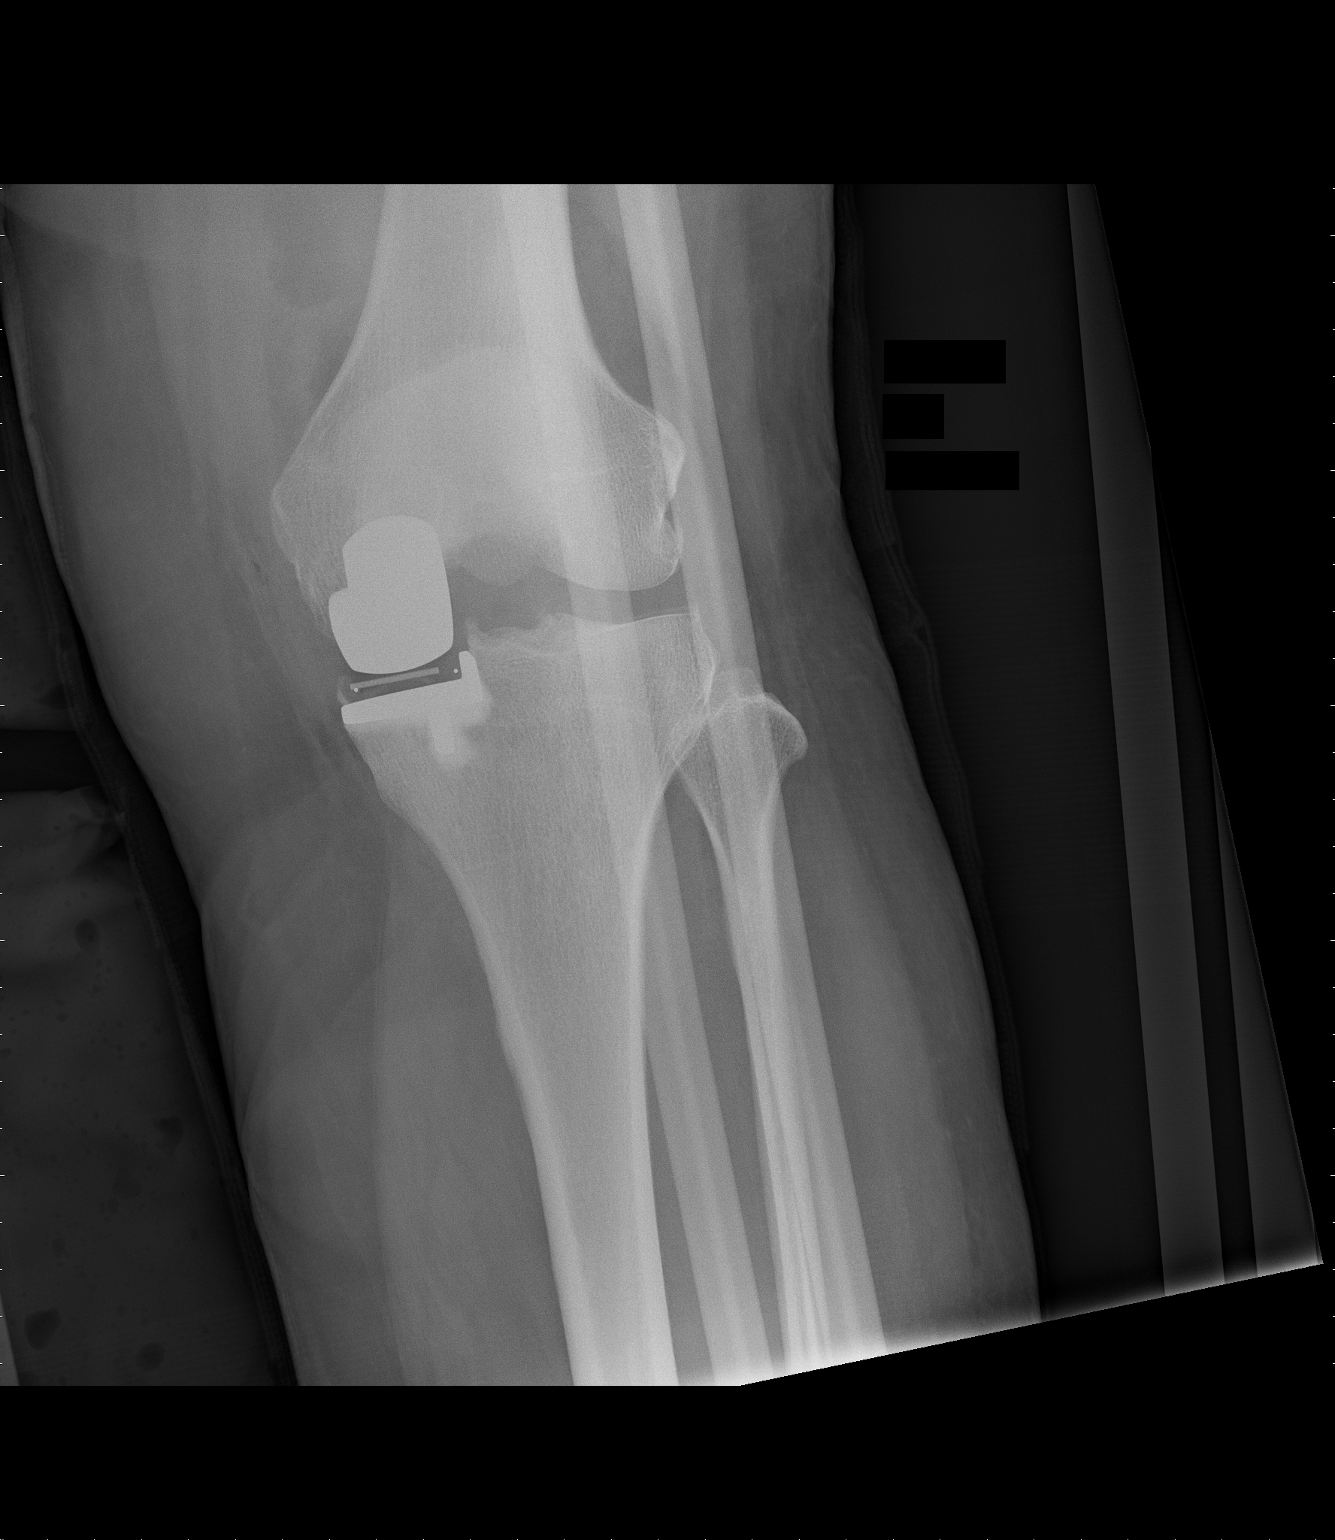

[knee lat]
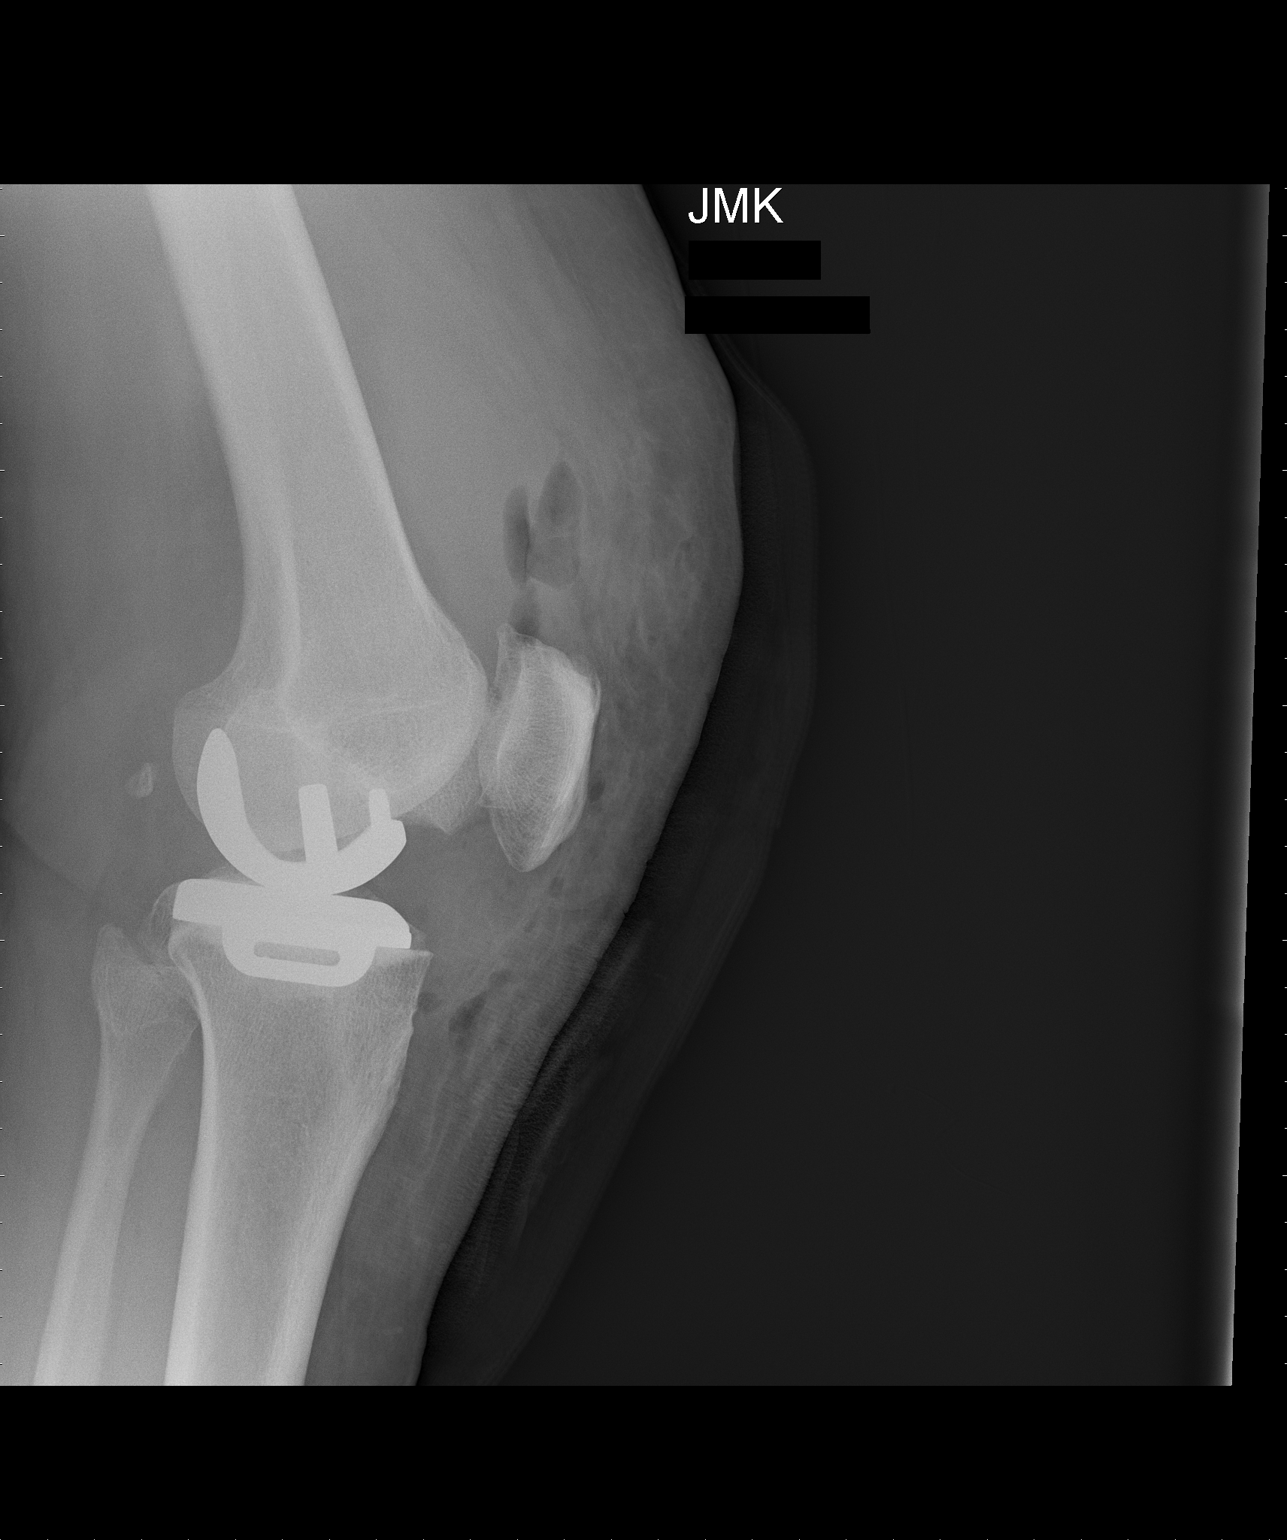

[2 of 2 positions shown; findings below may reference images not displayed]

FINDINGS: Portable radiographs of the left knee demonstrate sequelae of medial
compartment arthroplasty. The prosthetic components are
approximated. No acute fracture or dislocation is seen. There is a
knee joint effusion. Postoperative soft tissue gas and swelling are
noted.
IMPRESSION: Left knee unicompartmental arthroplasty as above.

## 2014-04-14 SURGERY — ARTHROPLASTY, KNEE, UNICOMPARTMENTAL
Anesthesia: Regional | Site: Knee | Laterality: Left

## 2014-04-14 MED ORDER — MAGNESIUM CITRATE PO SOLN: 1.0000 | Freq: Once | ORAL | Status: AC | PRN

## 2014-04-14 MED ORDER — RIVAROXABAN 10 MG PO TABS: 10.0000 mg | ORAL_TABLET | Freq: Every day | ORAL | Status: DC

## 2014-04-14 MED ORDER — ONDANSETRON HCL 4 MG PO TABS: 4.0000 mg | ORAL_TABLET | Freq: Three times a day (TID) | ORAL | Status: DC | PRN

## 2014-04-14 MED ORDER — DOCUSATE SODIUM 100 MG PO CAPS: 100.0000 mg | ORAL_CAPSULE | Freq: Two times a day (BID) | ORAL | Status: DC

## 2014-04-14 MED ORDER — HYDROMORPHONE HCL 1 MG/ML IJ SOLN
0.5000 mg | INTRAMUSCULAR | Status: DC | PRN
  Administered 2014-04-14 (×2): 0.5 mg via INTRAVENOUS

## 2014-04-14 MED ORDER — ONDANSETRON HCL 4 MG/2ML IJ SOLN: 4.0000 mg | Freq: Four times a day (QID) | INTRAMUSCULAR | Status: DC | PRN

## 2014-04-14 MED ORDER — ONDANSETRON HCL 4 MG PO TABS: 4.0000 mg | ORAL_TABLET | Freq: Four times a day (QID) | ORAL | Status: DC | PRN

## 2014-04-14 MED ORDER — METHOCARBAMOL 500 MG PO TABS
500.0000 mg | ORAL_TABLET | Freq: Four times a day (QID) | ORAL | Status: DC | PRN
  Administered 2014-04-14: 500 mg via ORAL
  Filled 2014-04-14 (×2): qty 1

## 2014-04-14 MED ORDER — CEFAZOLIN SODIUM-DEXTROSE 2-3 GM-% IV SOLR
2.0000 g | INTRAVENOUS | Status: AC
  Administered 2014-04-14: 2 g via INTRAVENOUS

## 2014-04-14 MED ORDER — DEXAMETHASONE SODIUM PHOSPHATE 10 MG/ML IJ SOLN
INTRAMUSCULAR | Status: DC | PRN
  Administered 2014-04-14: 4 mg via INTRAVENOUS

## 2014-04-14 MED ORDER — ZOLPIDEM TARTRATE 5 MG PO TABS: 5.0000 mg | ORAL_TABLET | Freq: Every evening | ORAL | Status: DC | PRN

## 2014-04-14 MED ORDER — ALLOPURINOL 100 MG PO TABS
100.0000 mg | ORAL_TABLET | Freq: Two times a day (BID) | ORAL | Status: DC
  Administered 2014-04-14: 100 mg via ORAL

## 2014-04-14 MED ORDER — SODIUM CHLORIDE 0.9 % IV SOLN
INTRAVENOUS | Status: DC
  Administered 2014-04-14: 12:00:00 via INTRAVENOUS

## 2014-04-14 MED ORDER — BACLOFEN 10 MG PO TABS: 10.0000 mg | ORAL_TABLET | Freq: Three times a day (TID) | ORAL | Status: DC

## 2014-04-14 MED ORDER — SCOPOLAMINE 1 MG/3DAYS TD PT72
MEDICATED_PATCH | TRANSDERMAL | Status: AC
Start: 2014-04-14 — End: 2014-04-14
  Filled 2014-04-14: qty 1

## 2014-04-14 MED ORDER — SIMVASTATIN 20 MG PO TABS
20.0000 mg | ORAL_TABLET | Freq: Every day | ORAL | Status: DC
  Administered 2014-04-14: 20 mg via ORAL

## 2014-04-14 MED ORDER — METOCLOPRAMIDE HCL 5 MG PO TABS: 5.0000 mg | ORAL_TABLET | Freq: Three times a day (TID) | ORAL | Status: DC | PRN

## 2014-04-14 MED ORDER — OXYCODONE HCL 5 MG PO TABS
5.0000 mg | ORAL_TABLET | ORAL | Status: DC | PRN
  Administered 2014-04-14: 10 mg via ORAL
  Administered 2014-04-14: 5 mg via ORAL
  Administered 2014-04-15: 10 mg via ORAL
  Filled 2014-04-14 (×2): qty 2
  Filled 2014-04-14: qty 1

## 2014-04-14 MED ORDER — PROPOFOL 10 MG/ML IV BOLUS
INTRAVENOUS | Status: DC | PRN
  Administered 2014-04-14: 150 mg via INTRAVENOUS

## 2014-04-14 MED ORDER — METOCLOPRAMIDE HCL 5 MG/ML IJ SOLN: 5.0000 mg | Freq: Three times a day (TID) | INTRAMUSCULAR | Status: DC | PRN

## 2014-04-14 MED ORDER — PANTOPRAZOLE SODIUM 40 MG PO TBEC
40.0000 mg | DELAYED_RELEASE_TABLET | Freq: Every day | ORAL | Status: DC
  Administered 2014-04-14: 40 mg via ORAL

## 2014-04-14 MED ORDER — OXYCODONE HCL 5 MG PO TABS: 5.0000 mg | ORAL_TABLET | Freq: Once | ORAL | Status: AC | PRN

## 2014-04-14 MED ORDER — FENTANYL CITRATE 0.05 MG/ML IJ SOLN
INTRAMUSCULAR | Status: AC
  Filled 2014-04-14: qty 2

## 2014-04-14 MED ORDER — PROPOFOL 10 MG/ML IV BOLUS
INTRAVENOUS | Status: AC
  Filled 2014-04-14: qty 20

## 2014-04-14 MED ORDER — CEFAZOLIN SODIUM 1-5 GM-% IV SOLN
INTRAVENOUS | Status: AC
  Filled 2014-04-14: qty 50

## 2014-04-14 MED ORDER — DIPHENHYDRAMINE HCL 50 MG/ML IJ SOLN
12.5000 mg | Freq: Four times a day (QID) | INTRAMUSCULAR | Status: DC | PRN
  Administered 2014-04-14 (×2): 12.5 mg via INTRAVENOUS
  Filled 2014-04-14 (×2): qty 1

## 2014-04-14 MED ORDER — FENTANYL CITRATE 0.05 MG/ML IJ SOLN
INTRAMUSCULAR | Status: AC
  Filled 2014-04-14: qty 6

## 2014-04-14 MED ORDER — CEFAZOLIN SODIUM 1-5 GM-% IV SOLN
1.0000 g | Freq: Four times a day (QID) | INTRAVENOUS | Status: AC
  Administered 2014-04-14 – 2014-04-15 (×3): 1 g via INTRAVENOUS

## 2014-04-14 MED ORDER — EPHEDRINE SULFATE 50 MG/ML IJ SOLN
INTRAMUSCULAR | Status: DC | PRN
  Administered 2014-04-14: 10 mg via INTRAVENOUS

## 2014-04-14 MED ORDER — SCOPOLAMINE 1 MG/3DAYS TD PT72
1.0000 | MEDICATED_PATCH | Freq: Once | TRANSDERMAL | Status: DC
  Administered 2014-04-14: 1.5 mg via TRANSDERMAL

## 2014-04-14 MED ORDER — BISACODYL 10 MG RE SUPP: 10.0000 mg | Freq: Every day | RECTAL | Status: DC | PRN

## 2014-04-14 MED ORDER — MIDAZOLAM HCL 2 MG/2ML IJ SOLN
1.0000 mg | INTRAMUSCULAR | Status: DC | PRN
  Administered 2014-04-14: 2 mg via INTRAVENOUS

## 2014-04-14 MED ORDER — HYDROMORPHONE HCL 1 MG/ML IJ SOLN
0.2500 mg | INTRAMUSCULAR | Status: DC | PRN
  Administered 2014-04-14 (×4): 0.5 mg via INTRAVENOUS

## 2014-04-14 MED ORDER — OXYCODONE-ACETAMINOPHEN 10-325 MG PO TABS: 1.0000 | ORAL_TABLET | Freq: Four times a day (QID) | ORAL | Status: DC | PRN

## 2014-04-14 MED ORDER — ONDANSETRON HCL 4 MG/2ML IJ SOLN
INTRAMUSCULAR | Status: DC | PRN
  Administered 2014-04-14: 4 mg via INTRAVENOUS

## 2014-04-14 MED ORDER — METHOCARBAMOL 1000 MG/10ML IJ SOLN
500.0000 mg | Freq: Four times a day (QID) | INTRAVENOUS | Status: DC | PRN
  Administered 2014-04-14: 500 mg via INTRAVENOUS

## 2014-04-14 MED ORDER — LACTATED RINGERS IV SOLN
INTRAVENOUS | Status: DC
  Administered 2014-04-14 (×3): via INTRAVENOUS

## 2014-04-14 MED ORDER — FENTANYL CITRATE 0.05 MG/ML IJ SOLN
INTRAMUSCULAR | Status: DC | PRN
Start: 2014-04-14 — End: 2014-04-14
  Administered 2014-04-14: 25 ug via INTRAVENOUS
  Administered 2014-04-14: 50 ug via INTRAVENOUS

## 2014-04-14 MED ORDER — BUPIVACAINE-EPINEPHRINE (PF) 0.5% -1:200000 IJ SOLN
INTRAMUSCULAR | Status: DC | PRN
  Administered 2014-04-14: 30 mL via PERINEURAL

## 2014-04-14 MED ORDER — HYDROMORPHONE HCL 1 MG/ML IJ SOLN
INTRAMUSCULAR | Status: AC
  Filled 2014-04-14: qty 1

## 2014-04-14 MED ORDER — POLYETHYLENE GLYCOL 3350 17 G PO PACK: 17.0000 g | PACK | Freq: Every day | ORAL | Status: DC | PRN

## 2014-04-14 MED ORDER — OXYCODONE HCL 5 MG/5ML PO SOLN: 5.0000 mg | Freq: Once | ORAL | Status: AC | PRN

## 2014-04-14 MED ORDER — SENNA-DOCUSATE SODIUM 8.6-50 MG PO TABS: 2.0000 | ORAL_TABLET | Freq: Every day | ORAL | Status: DC

## 2014-04-14 MED ORDER — OXYCODONE-ACETAMINOPHEN 5-325 MG PO TABS
1.0000 | ORAL_TABLET | ORAL | Status: DC | PRN
  Administered 2014-04-14: 2 via ORAL
  Filled 2014-04-14: qty 2

## 2014-04-14 MED ORDER — INSULIN ASPART 100 UNIT/ML ~~LOC~~ SOLN: 0.0000 [IU] | Freq: Three times a day (TID) | SUBCUTANEOUS | Status: DC

## 2014-04-14 MED ORDER — SENNA 8.6 MG PO TABS: 1.0000 | ORAL_TABLET | Freq: Two times a day (BID) | ORAL | Status: DC

## 2014-04-14 MED ORDER — LOSARTAN POTASSIUM 50 MG PO TABS: 50.0000 mg | ORAL_TABLET | Freq: Every day | ORAL | Status: DC

## 2014-04-14 MED ORDER — FENTANYL CITRATE 0.05 MG/ML IJ SOLN
50.0000 ug | INTRAMUSCULAR | Status: DC | PRN
  Administered 2014-04-14: 100 ug via INTRAVENOUS

## 2014-04-14 MED ORDER — LIDOCAINE HCL (CARDIAC) 20 MG/ML IV SOLN
INTRAVENOUS | Status: DC | PRN
  Administered 2014-04-14: 30 mg via INTRAVENOUS

## 2014-04-14 MED ORDER — MIDAZOLAM HCL 2 MG/2ML IJ SOLN
INTRAMUSCULAR | Status: AC
  Filled 2014-04-14: qty 2

## 2014-04-14 MED ORDER — METOPROLOL SUCCINATE ER 25 MG PO TB24: 25.0000 mg | ORAL_TABLET | Freq: Every day | ORAL | Status: DC

## 2014-04-14 MED ORDER — HYDROMORPHONE HCL 1 MG/ML IJ SOLN
0.5000 mg | INTRAMUSCULAR | Status: DC | PRN
  Administered 2014-04-14: 1 mg via INTRAVENOUS
  Filled 2014-04-14: qty 1

## 2014-04-14 SURGICAL SUPPLY — 62 items
BANDAGE ELASTIC 6 VELCRO ST LF (GAUZE/BANDAGES/DRESSINGS) ×2 IMPLANT
BANDAGE ESMARK 6X9 LF (GAUZE/BANDAGES/DRESSINGS) ×1 IMPLANT
BENZOIN TINCTURE PRP APPL 2/3 (GAUZE/BANDAGES/DRESSINGS) ×2 IMPLANT
BLADE SURG 10 STRL SS (BLADE) ×2 IMPLANT
BLADE SURG 15 STRL LF DISP TIS (BLADE) ×2 IMPLANT
BLADE SURG 15 STRL SS (BLADE) ×2
BLADE VORTEX 6.0 (BLADE) IMPLANT
BNDG ESMARK 6X9 LF (GAUZE/BANDAGES/DRESSINGS) ×2
BOWL SMART MIX CTS (DISPOSABLE) ×2 IMPLANT
CANISTER SUCT 1200ML W/VALVE (MISCELLANEOUS) IMPLANT
CAPT KNEE OXFORD ×2 IMPLANT
CEMENT HV SMART SET (Cement) ×2 IMPLANT
CLSR STERI-STRIP ANTIMIC 1/2X4 (GAUZE/BANDAGES/DRESSINGS) ×2 IMPLANT
COVER BACK TABLE 60X90IN (DRAPES) ×2 IMPLANT
CUFF TOURNIQUET SINGLE 34IN LL (TOURNIQUET CUFF) ×2 IMPLANT
DECANTER SPIKE VIAL GLASS SM (MISCELLANEOUS) IMPLANT
DRAPE EXTREMITY T 121X128X90 (DRAPE) ×2 IMPLANT
DRAPE U 20/CS (DRAPES) ×2 IMPLANT
DRAPE U-SHAPE 47X51 STRL (DRAPES) ×2 IMPLANT
DRSG PAD ABDOMINAL 8X10 ST (GAUZE/BANDAGES/DRESSINGS) ×2 IMPLANT
DURAPREP 26ML APPLICATOR (WOUND CARE) ×2 IMPLANT
ELECT REM PT RETURN 9FT ADLT (ELECTROSURGICAL) ×2
ELECTRODE REM PT RTRN 9FT ADLT (ELECTROSURGICAL) ×1 IMPLANT
FACESHIELD WRAPAROUND (MASK) ×4 IMPLANT
GAUZE SPONGE 4X4 12PLY STRL (GAUZE/BANDAGES/DRESSINGS) ×2 IMPLANT
GLOVE BIO SURGEON STRL SZ 6.5 (GLOVE) ×2 IMPLANT
GLOVE BIO SURGEON STRL SZ8 (GLOVE) ×2 IMPLANT
GLOVE BIOGEL PI IND STRL 7.0 (GLOVE) ×1 IMPLANT
GLOVE BIOGEL PI IND STRL 8 (GLOVE) ×2 IMPLANT
GLOVE BIOGEL PI INDICATOR 7.0 (GLOVE) ×1
GLOVE BIOGEL PI INDICATOR 8 (GLOVE) ×2
GLOVE ORTHO TXT STRL SZ7.5 (GLOVE) ×2 IMPLANT
GOWN STRL REUS W/ TWL LRG LVL3 (GOWN DISPOSABLE) ×1 IMPLANT
GOWN STRL REUS W/ TWL XL LVL3 (GOWN DISPOSABLE) ×2 IMPLANT
GOWN STRL REUS W/TWL LRG LVL3 (GOWN DISPOSABLE) ×1
GOWN STRL REUS W/TWL XL LVL3 (GOWN DISPOSABLE) ×2
HANDPIECE INTERPULSE COAX TIP (DISPOSABLE) ×2
IMMOBILIZER KNEE 22 UNIV (SOFTGOODS) ×2 IMPLANT
IMMOBILIZER KNEE 24 THIGH 36 (MISCELLANEOUS) IMPLANT
IMMOBILIZER KNEE 24 UNIV (MISCELLANEOUS)
NS IRRIG 1000ML POUR BTL (IV SOLUTION) ×2 IMPLANT
PACK ARTHROSCOPY DSU (CUSTOM PROCEDURE TRAY) ×2 IMPLANT
PACK BASIN DAY SURGERY FS (CUSTOM PROCEDURE TRAY) ×2 IMPLANT
PENCIL BUTTON HOLSTER BLD 10FT (ELECTRODE) ×2 IMPLANT
SAWBLADE OXFORD PARTIAL (BLADE) ×2 IMPLANT
SET HNDPC FAN SPRY TIP SCT (DISPOSABLE) ×2 IMPLANT
SHEET MEDIUM DRAPE 40X70 STRL (DRAPES) ×2 IMPLANT
SLEEVE SCD COMPRESS KNEE MED (MISCELLANEOUS) ×2 IMPLANT
SPONGE LAP 18X18 X RAY DECT (DISPOSABLE) ×2 IMPLANT
SUCTION FRAZIER TIP 10 FR DISP (SUCTIONS) ×4 IMPLANT
SUT MNCRL AB 4-0 PS2 18 (SUTURE) IMPLANT
SUT VIC AB 0 CT1 27 (SUTURE) ×1
SUT VIC AB 0 CT1 27XBRD ANBCTR (SUTURE) ×1 IMPLANT
SUT VIC AB 2-0 SH 27 (SUTURE) ×1
SUT VIC AB 2-0 SH 27XBRD (SUTURE) ×1 IMPLANT
SUT VICRYL 3-0 CR8 SH (SUTURE) ×2 IMPLANT
SUT VICRYL 4-0 PS2 18IN ABS (SUTURE) IMPLANT
SYR BULB IRRIGATION 50ML (SYRINGE) ×2 IMPLANT
TOWEL OR 17X24 6PK STRL BLUE (TOWEL DISPOSABLE) ×2 IMPLANT
TOWEL OR NON WOVEN STRL DISP B (DISPOSABLE) ×4 IMPLANT
TUBE CONNECTING 20X1/4 (TUBING) ×2 IMPLANT
YANKAUER SUCT BULB TIP NO VENT (SUCTIONS) ×2 IMPLANT

## 2014-04-14 NOTE — H&P (Signed)
.  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 Symptoms are rated as moderate to severe, and have been worsening.  This is significantly impairing activities of daily living.  She has elected for surgical management. She has failed injections, activity modification, anti-inflammatories, and assistive devices.   Past Medical History  Diagnosis Date  . Hypertension   . Diabetes mellitus without complication   . GERD (gastroesophageal reflux disease)   . Arthritis   . Hyperlipemia   . Wears glasses   . Wears partial dentures    Past Surgical History  Procedure Laterality Date  . Tubal ligation    . Dilation and curettage of uterus    . Colonoscopy     History   Social History  . Marital Status: Married    Spouse Name: N/A    Number of Children: N/A  . Years of Education: N/A   Social History Main Topics  . Smoking status: Never Smoker   . Smokeless tobacco: None  . Alcohol Use: No  . Drug Use: No  . Sexual Activity: None   Other Topics Concern  . None   Social History Narrative  . None   History reviewed. No pertinent family history. No Known Allergies Prior to Admission medications   Medication Sig Start Date End Date Taking? Authorizing Provider  allopurinol (ZYLOPRIM) 100 MG tablet Take 100 mg by mouth 2 (two) times daily.   Yes Historical Provider, MD  losartan (COZAAR) 50 MG tablet Take 50 mg by mouth daily.   Yes Historical Provider, MD  metFORMIN (GLUMETZA) 500 MG (MOD) 24 hr tablet Take 500 mg by mouth every evening.   Yes Historical Provider, MD  metoprolol succinate (TOPROL-XL) 25 MG 24 hr tablet Take 25 mg by mouth daily.   Yes Historical Provider, MD  pantoprazole (PROTONIX) 40 MG tablet Take 40 mg by mouth daily.   Yes Historical  Provider, MD  simvastatin (ZOCOR) 20 MG tablet Take 20 mg by mouth daily at 6 PM.   Yes Historical Provider, MD     Positive ROS: All other systems have been reviewed and were otherwise negative with the exception of those mentioned in the HPI and as above.  Physical Exam: General: Alert, no acute distress Cardiovascular: No pedal edema Respiratory: No cyanosis, no use of accessory musculature GI: No organomegaly, abdomen is soft and non-tender Skin: No lesions in the area of chief complaint Neurologic: Sensation intact distally Psychiatric: Patient is competent for consent with normal mood and affect Lymphatic: No axillary or cervical lymphadenopathy  MUSCULOSKELETAL: left knee has positive medial pain, crepitance, rom 0-130 degrees, positive pseudolaxity  XR with end stage Anteromedial osteoarthritis with osteophyte formation, subchondral sclerosis and loss of joint space  Assessment: 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   Eulas PostLANDAU,Jadyn Barge P, MD Cell [phone]   04/14/2014 6:33 AM

## 2014-04-14 NOTE — Transfer of Care (Signed)
Immediate Anesthesia Transfer of Care Note  Patient: Brooke Villa  Procedure(s) Performed: Procedure(s): LEFT PARTIAL KNEE REPLACEMENT MEDIAL COMPARTMENT (Left)  Patient Location: PACU  Anesthesia Type:GA combined with regional for post-op pain  Level of Consciousness: awake, alert , oriented and patient cooperative  Airway & Oxygen Therapy: Patient Spontanous Breathing and Patient connected to face mask oxygen  Post-op Assessment: Report given to PACU RN and Post -op Vital signs reviewed and stable  Post vital signs: Reviewed and stable  Complications: No apparent anesthesia complications

## 2014-04-14 NOTE — Progress Notes (Signed)
All treatments and assessment performed by c rathbone rn charted under [employer]

## 2014-04-14 NOTE — Progress Notes (Signed)
Pt up to bathroom, tolerated well, ambulated well.

## 2014-04-14 NOTE — Progress Notes (Signed)
Assisted Dr. Edmond Fitzgerald with left, ultrasound guided, femoral block. Side rails up, monitors on throughout procedure. See vital signs in flow sheet. Tolerated Procedure well. 

## 2014-04-14 NOTE — Op Note (Signed)
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  ANESTHESIA:   General  PREOPERATIVE INDICATIONS:  Brooke Villa is a  68 y.o. 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    The risks benefits and alternatives were discussed with the patient preoperatively including but not limited to the risks of infection, bleeding, nerve injury, cardiopulmonary complications, blood clots, the need for revision surgery, among others, and the patient was willing to proceed.  OPERATIVE IMPLANTS: Biomet Oxford mobile bearing medial compartment arthroplasty femur size small, tibia size B, bearing size 4.  OPERATIVE FINDINGS: Endstage grade 4 medial compartment osteoarthritis. No significant changes in the lateral or patellofemoral joint, small amount of grade 2 changes in an isolated area central patellofemoral joint.  The ACL was intact.  OPERATIVE PROCEDURE: The patient was brought to the operating room placed in supine position. General anesthesia was administered. IV antibiotics were given. The lower extremity was placed in the legholder and prepped and draped in usual sterile fashion.  Time out was performed.  The leg was elevated and exsanguinated and the tourniquet was inflated. Anteromedial incision was performed, and I took care to preserve the MCL. Parapatellar incision was carried out, and the osteophytes were excised, along with the medial meniscus and a small portion of the fat pad.  The  extra medullary tibial cutting jig was applied, using the spoon and the 4mm G-Clamp and the 2 mm shim, and I took care to protect the anterior cruciate ligament insertion and the tibial spine. The medial collateral ligament was also protected, and I resected my proximal tibia, matching the anatomic slope.   The proximal tibial bony cut was removed in one piece, and I turned my attention to the femur.  The intramedullary femoral rod was placed using the drill, and then using the appropriate reference, I assembled the femoral jig, setting my posterior cutting block. I resected my posterior femur, used the 0 spigot for the anterior femur, and then measured my gap.   I then used the appropriate mill to match the extension gap to the flexion gap. The second milling was at a 2.  The gaps were then measured again with the appropriate feeler gauges. Once I had balanced flexion and extension gaps, I then completed the preparation of the femur. Interestingly, the extension gap measured two before the distal femoral resection  I milled off the anterior aspect of the distal femur to prevent impingement. I also exposed the tibia, and selected the above-named component, and then used the cutting jig to prepare the keel slot on the tibia. I also used the awl to curette out the bone to complete the preparation of the keel. The back wall was intact.  I then placed trial components, and it was found to have excellent motion, and appropriate balance.  I then cemented the components into place, cementing the tibia first, removing all excess cement, and then cementing the femur.  All loose cement was removed.  The real polyethylene insert was applied manually, and the knee was taken through functional range of motion, and  found to have excellent stability and restoration of joint motion, with excellent balance.  The wounds were irrigated copiously, and the parapatellar tissue closed with Vicryl, followed by Vicryl for  the subcutaneous tissue, with routine closure with Steri-Strips and sterile gauze.  The tourniquet was released, and the patient was awakened and extubated and returned to PACU in stable and satisfactory condition. There were no complications.

## 2014-04-14 NOTE — Anesthesia Procedure Notes (Addendum)
Anesthesia Regional Block:  Femoral nerve block  Pre-Anesthetic Checklist: ,, timeout performed, Correct Patient, Correct Site, Correct Laterality, Correct Procedure, Correct Position, site marked, Risks and benefits discussed, pre-op evaluation,  At surgeon's request and post-op pain management  Laterality: Left  Prep: Maximum Sterile Barrier Precautions used and chloraprep       Needles:  Injection technique: Single-shot  Needle Type: Echogenic Stimulator Needle     Needle Length: 5cm 5 cm Needle Gauge: 22 and 22 G    Additional Needles:  Procedures: ultrasound guided (picture in chart) Femoral nerve block  Nerve Stimulator or Paresthesia:  Response: Patellar respose,   Additional Responses:   Narrative:  Start time: 04/14/2014 7:10 AM End time: 04/14/2014 7:19 AM Injection made incrementally with aspirations every 5 mL. Anesthesiologist: Fitzgerald,MD  Additional Notes: 2% Lidocaine skin wheel.    Procedure Name: LMA Insertion Date/Time: 04/14/2014 7:49 AM Performed by: Philippa Vessey Pre-anesthesia Checklist: Patient identified, Emergency Drugs available, Suction available and Patient being monitored Patient Re-evaluated:Patient Re-evaluated prior to inductionOxygen Delivery Method: Circle System Utilized Preoxygenation: Pre-oxygenation with 100% oxygen Intubation Type: IV induction Ventilation: Mask ventilation without difficulty LMA: LMA inserted LMA Size: 4.0 Number of attempts: 1 Airway Equipment and Method: bite block Placement Confirmation: positive ETCO2 Tube secured with: Tape Dental Injury: Teeth and Oropharynx as per pre-operative assessment

## 2014-04-14 NOTE — Anesthesia Postprocedure Evaluation (Signed)
  Anesthesia Post-op Note  Patient: Brooke Villa  Procedure(s) Performed: Procedure(s): LEFT PARTIAL KNEE REPLACEMENT MEDIAL COMPARTMENT (Left)  Patient Location: PACU  Anesthesia Type: General, Regional   Level of Consciousness: awake, alert  and oriented  Airway and Oxygen Therapy: Patient Spontanous Breathing  Post-op Pain: mild  Post-op Assessment: Post-op Vital signs reviewed  Post-op Vital Signs: Reviewed  Last Vitals:  Filed Vitals:   04/14/14 1515  BP: 172/79  Pulse:   Temp: 36.8 C  Resp: 18    Complications: No apparent anesthesia complications

## 2014-04-14 NOTE — Discharge Instructions (Signed)
Diet: As you were doing prior to hospitalization  ° °Shower:  May shower but keep the wounds dry, use an occlusive plastic wrap, NO SOAKING IN TUB.  If the bandage gets wet, change with a clean dry gauze. ° °Dressing:  You may change your dressing 3-5 days after surgery.  Then change the dressing daily with sterile gauze dressing.   ° °There are sticky tapes (steri-strips) on your wounds and all the stitches are absorbable.  Leave the steri-strips in place when changing your dressings, they will peel off with time, usually 2-3 weeks. ° °Activity:  Increase activity slowly as tolerated, but follow the weight bearing instructions below.  No lifting or driving for 6 weeks. ° °Weight Bearing:   As tolerated.   ° °To prevent constipation: you may use a stool softener such as - ° °Colace (over the counter) 100 mg by mouth twice a day  °Drink plenty of fluids (prune juice may be helpful) and high fiber foods °Miralax (over the counter) for constipation as needed.   ° °Itching:  If you experience itching with your medications, try taking only a single pain pill, or even half a pain pill at a time.  You may take up to 10 pain pills per day, and you can also use benadryl over the counter for itching or also to help with sleep.  ° °Precautions:  If you experience chest pain or shortness of breath - call 911 immediately for transfer to the hospital emergency department!! ° °If you develop a fever greater that 101 F, purulent drainage from wound, increased redness or drainage from wound, or calf pain -- Call the office at [phone]                                                °Follow- Up Appointment:  Please call for an appointment to be seen in 2 weeks Export - [phone] ° ° ° ° ° °

## 2014-04-14 NOTE — Anesthesia Preprocedure Evaluation (Addendum)
Anesthesia Evaluation  Patient identified by MRN, date of birth, ID band Patient awake    Reviewed: Allergy & Precautions, H&P , NPO status , Patient's Chart, lab work & pertinent test results, reviewed documented beta blocker date and time   Airway Mallampati: III  TM Distance: >3 FB Neck ROM: Full    Dental no notable dental hx. (+) Partial Lower, Partial Upper   Pulmonary neg pulmonary ROS,  breath sounds clear to auscultation  Pulmonary exam normal       Cardiovascular hypertension, Pt. on home beta blockers Rhythm:Regular Rate:Normal     Neuro/Psych negative neurological ROS  negative psych ROS   GI/Hepatic Neg liver ROS, GERD-  Medicated and Controlled,  Endo/Other  diabetes, Type 2, Oral Hypoglycemic AgentsMorbid obesity  Renal/GU negative Renal ROS  negative genitourinary   Musculoskeletal  (+) Arthritis -, Osteoarthritis,    Abdominal   Peds  Hematology negative hematology ROS (+)   Anesthesia Other Findings   Reproductive/Obstetrics negative OB ROS                            Anesthesia Physical Anesthesia Plan  ASA: III  Anesthesia Plan: General and Regional   Post-op Pain Management:    Induction: Intravenous  Airway Management Planned: LMA  Additional Equipment:   Intra-op Plan:   Post-operative Plan: Extubation in OR  Informed Consent: I have reviewed the patients History and Physical, chart, labs and discussed the procedure including the risks, benefits and alternatives for the proposed anesthesia with the patient or authorized representative who has indicated his/her understanding and acceptance.   Dental advisory given  Plan Discussed with: CRNA  Anesthesia Plan Comments:         Anesthesia Quick Evaluation

## 2014-04-15 DIAGNOSIS — I10 Essential (primary) hypertension: Secondary | ICD-10-CM | POA: Diagnosis not present

## 2014-04-15 DIAGNOSIS — K219 Gastro-esophageal reflux disease without esophagitis: Secondary | ICD-10-CM | POA: Diagnosis not present

## 2014-04-15 DIAGNOSIS — M179 Osteoarthritis of knee, unspecified: Secondary | ICD-10-CM | POA: Diagnosis not present

## 2014-04-15 DIAGNOSIS — Z79899 Other long term (current) drug therapy: Secondary | ICD-10-CM | POA: Diagnosis not present

## 2014-04-15 DIAGNOSIS — E785 Hyperlipidemia, unspecified: Secondary | ICD-10-CM | POA: Diagnosis not present

## 2014-04-15 DIAGNOSIS — E119 Type 2 diabetes mellitus without complications: Secondary | ICD-10-CM | POA: Diagnosis not present

## 2014-04-16 DIAGNOSIS — I10 Essential (primary) hypertension: Secondary | ICD-10-CM | POA: Diagnosis not present

## 2014-04-16 DIAGNOSIS — E119 Type 2 diabetes mellitus without complications: Secondary | ICD-10-CM | POA: Diagnosis not present

## 2014-04-16 DIAGNOSIS — Z96652 Presence of left artificial knee joint: Secondary | ICD-10-CM | POA: Diagnosis not present

## 2014-04-16 DIAGNOSIS — R269 Unspecified abnormalities of gait and mobility: Secondary | ICD-10-CM | POA: Diagnosis not present

## 2014-04-16 DIAGNOSIS — Z471 Aftercare following joint replacement surgery: Secondary | ICD-10-CM | POA: Diagnosis not present

## 2014-04-16 DIAGNOSIS — M1712 Unilateral primary osteoarthritis, left knee: Secondary | ICD-10-CM | POA: Diagnosis not present

## 2014-04-17 DIAGNOSIS — I10 Essential (primary) hypertension: Secondary | ICD-10-CM | POA: Diagnosis not present

## 2014-04-17 DIAGNOSIS — E119 Type 2 diabetes mellitus without complications: Secondary | ICD-10-CM | POA: Diagnosis not present

## 2014-04-17 DIAGNOSIS — R269 Unspecified abnormalities of gait and mobility: Secondary | ICD-10-CM | POA: Diagnosis not present

## 2014-04-17 DIAGNOSIS — Z96652 Presence of left artificial knee joint: Secondary | ICD-10-CM | POA: Diagnosis not present

## 2014-04-17 DIAGNOSIS — Z471 Aftercare following joint replacement surgery: Secondary | ICD-10-CM | POA: Diagnosis not present

## 2014-04-19 DIAGNOSIS — Z96652 Presence of left artificial knee joint: Secondary | ICD-10-CM | POA: Diagnosis not present

## 2014-04-19 DIAGNOSIS — R269 Unspecified abnormalities of gait and mobility: Secondary | ICD-10-CM | POA: Diagnosis not present

## 2014-04-19 DIAGNOSIS — E119 Type 2 diabetes mellitus without complications: Secondary | ICD-10-CM | POA: Diagnosis not present

## 2014-04-19 DIAGNOSIS — Z471 Aftercare following joint replacement surgery: Secondary | ICD-10-CM | POA: Diagnosis not present

## 2014-04-19 DIAGNOSIS — I10 Essential (primary) hypertension: Secondary | ICD-10-CM | POA: Diagnosis not present

## 2014-04-21 ENCOUNTER — Ambulatory Visit: Payer: Medicare Other

## 2014-04-21 DIAGNOSIS — I10 Essential (primary) hypertension: Secondary | ICD-10-CM | POA: Diagnosis not present

## 2014-04-21 DIAGNOSIS — Z471 Aftercare following joint replacement surgery: Secondary | ICD-10-CM | POA: Diagnosis not present

## 2014-04-21 DIAGNOSIS — E119 Type 2 diabetes mellitus without complications: Secondary | ICD-10-CM | POA: Diagnosis not present

## 2014-04-21 DIAGNOSIS — Z96652 Presence of left artificial knee joint: Secondary | ICD-10-CM | POA: Diagnosis not present

## 2014-04-21 DIAGNOSIS — R269 Unspecified abnormalities of gait and mobility: Secondary | ICD-10-CM | POA: Diagnosis not present

## 2014-04-24 DIAGNOSIS — Z471 Aftercare following joint replacement surgery: Secondary | ICD-10-CM | POA: Diagnosis not present

## 2014-04-24 DIAGNOSIS — Z96652 Presence of left artificial knee joint: Secondary | ICD-10-CM | POA: Diagnosis not present

## 2014-04-24 DIAGNOSIS — I10 Essential (primary) hypertension: Secondary | ICD-10-CM | POA: Diagnosis not present

## 2014-04-24 DIAGNOSIS — R269 Unspecified abnormalities of gait and mobility: Secondary | ICD-10-CM | POA: Diagnosis not present

## 2014-04-24 DIAGNOSIS — E119 Type 2 diabetes mellitus without complications: Secondary | ICD-10-CM | POA: Diagnosis not present

## 2014-04-26 DIAGNOSIS — Z96652 Presence of left artificial knee joint: Secondary | ICD-10-CM | POA: Diagnosis not present

## 2014-04-26 DIAGNOSIS — E119 Type 2 diabetes mellitus without complications: Secondary | ICD-10-CM | POA: Diagnosis not present

## 2014-04-26 DIAGNOSIS — R269 Unspecified abnormalities of gait and mobility: Secondary | ICD-10-CM | POA: Diagnosis not present

## 2014-04-26 DIAGNOSIS — Z471 Aftercare following joint replacement surgery: Secondary | ICD-10-CM | POA: Diagnosis not present

## 2014-04-26 DIAGNOSIS — I10 Essential (primary) hypertension: Secondary | ICD-10-CM | POA: Diagnosis not present

## 2014-04-27 DIAGNOSIS — Z96652 Presence of left artificial knee joint: Secondary | ICD-10-CM | POA: Diagnosis not present

## 2014-04-28 DIAGNOSIS — I10 Essential (primary) hypertension: Secondary | ICD-10-CM | POA: Diagnosis not present

## 2014-04-28 DIAGNOSIS — R269 Unspecified abnormalities of gait and mobility: Secondary | ICD-10-CM | POA: Diagnosis not present

## 2014-04-28 DIAGNOSIS — Z96652 Presence of left artificial knee joint: Secondary | ICD-10-CM | POA: Diagnosis not present

## 2014-04-28 DIAGNOSIS — E119 Type 2 diabetes mellitus without complications: Secondary | ICD-10-CM | POA: Diagnosis not present

## 2014-04-28 DIAGNOSIS — Z471 Aftercare following joint replacement surgery: Secondary | ICD-10-CM | POA: Diagnosis not present

## 2014-05-01 DIAGNOSIS — M1712 Unilateral primary osteoarthritis, left knee: Secondary | ICD-10-CM | POA: Diagnosis not present

## 2014-05-09 DIAGNOSIS — M6281 Muscle weakness (generalized): Secondary | ICD-10-CM | POA: Diagnosis not present

## 2014-05-09 DIAGNOSIS — M25662 Stiffness of left knee, not elsewhere classified: Secondary | ICD-10-CM | POA: Diagnosis not present

## 2014-05-09 DIAGNOSIS — M25651 Stiffness of right hip, not elsewhere classified: Secondary | ICD-10-CM | POA: Diagnosis not present

## 2014-05-09 DIAGNOSIS — M25562 Pain in left knee: Secondary | ICD-10-CM | POA: Diagnosis not present

## 2014-05-10 ENCOUNTER — Ambulatory Visit: Payer: Medicare Other

## 2014-05-10 DIAGNOSIS — M6281 Muscle weakness (generalized): Secondary | ICD-10-CM | POA: Diagnosis not present

## 2014-05-10 DIAGNOSIS — R262 Difficulty in walking, not elsewhere classified: Secondary | ICD-10-CM | POA: Diagnosis not present

## 2014-05-10 DIAGNOSIS — M25662 Stiffness of left knee, not elsewhere classified: Secondary | ICD-10-CM | POA: Diagnosis not present

## 2014-05-10 DIAGNOSIS — M25562 Pain in left knee: Secondary | ICD-10-CM | POA: Diagnosis not present

## 2014-05-15 DIAGNOSIS — M25562 Pain in left knee: Secondary | ICD-10-CM | POA: Diagnosis not present

## 2014-05-15 DIAGNOSIS — M6281 Muscle weakness (generalized): Secondary | ICD-10-CM | POA: Diagnosis not present

## 2014-05-15 DIAGNOSIS — R262 Difficulty in walking, not elsewhere classified: Secondary | ICD-10-CM | POA: Diagnosis not present

## 2014-05-15 DIAGNOSIS — M25662 Stiffness of left knee, not elsewhere classified: Secondary | ICD-10-CM | POA: Diagnosis not present

## 2014-05-17 DIAGNOSIS — M25562 Pain in left knee: Secondary | ICD-10-CM | POA: Diagnosis not present

## 2014-05-17 DIAGNOSIS — M6281 Muscle weakness (generalized): Secondary | ICD-10-CM | POA: Diagnosis not present

## 2014-05-17 DIAGNOSIS — M25662 Stiffness of left knee, not elsewhere classified: Secondary | ICD-10-CM | POA: Diagnosis not present

## 2014-05-17 DIAGNOSIS — R262 Difficulty in walking, not elsewhere classified: Secondary | ICD-10-CM | POA: Diagnosis not present

## 2014-05-19 DIAGNOSIS — R262 Difficulty in walking, not elsewhere classified: Secondary | ICD-10-CM | POA: Diagnosis not present

## 2014-05-19 DIAGNOSIS — M25562 Pain in left knee: Secondary | ICD-10-CM | POA: Diagnosis not present

## 2014-05-19 DIAGNOSIS — M25662 Stiffness of left knee, not elsewhere classified: Secondary | ICD-10-CM | POA: Diagnosis not present

## 2014-05-19 DIAGNOSIS — M6281 Muscle weakness (generalized): Secondary | ICD-10-CM | POA: Diagnosis not present

## 2014-05-22 DIAGNOSIS — R262 Difficulty in walking, not elsewhere classified: Secondary | ICD-10-CM | POA: Diagnosis not present

## 2014-05-22 DIAGNOSIS — M25662 Stiffness of left knee, not elsewhere classified: Secondary | ICD-10-CM | POA: Diagnosis not present

## 2014-05-22 DIAGNOSIS — M25562 Pain in left knee: Secondary | ICD-10-CM | POA: Diagnosis not present

## 2014-05-22 DIAGNOSIS — M6281 Muscle weakness (generalized): Secondary | ICD-10-CM | POA: Diagnosis not present

## 2014-05-31 DIAGNOSIS — M25662 Stiffness of left knee, not elsewhere classified: Secondary | ICD-10-CM | POA: Diagnosis not present

## 2014-05-31 DIAGNOSIS — M25562 Pain in left knee: Secondary | ICD-10-CM | POA: Diagnosis not present

## 2014-05-31 DIAGNOSIS — M6281 Muscle weakness (generalized): Secondary | ICD-10-CM | POA: Diagnosis not present

## 2014-05-31 DIAGNOSIS — R262 Difficulty in walking, not elsewhere classified: Secondary | ICD-10-CM | POA: Diagnosis not present

## 2014-06-02 DIAGNOSIS — M6281 Muscle weakness (generalized): Secondary | ICD-10-CM | POA: Diagnosis not present

## 2014-06-02 DIAGNOSIS — M25562 Pain in left knee: Secondary | ICD-10-CM | POA: Diagnosis not present

## 2014-06-02 DIAGNOSIS — M25662 Stiffness of left knee, not elsewhere classified: Secondary | ICD-10-CM | POA: Diagnosis not present

## 2014-06-02 DIAGNOSIS — R262 Difficulty in walking, not elsewhere classified: Secondary | ICD-10-CM | POA: Diagnosis not present

## 2014-06-05 DIAGNOSIS — M25562 Pain in left knee: Secondary | ICD-10-CM | POA: Diagnosis not present

## 2014-06-05 DIAGNOSIS — M25662 Stiffness of left knee, not elsewhere classified: Secondary | ICD-10-CM | POA: Diagnosis not present

## 2014-06-05 DIAGNOSIS — M76811 Anterior tibial syndrome, right leg: Secondary | ICD-10-CM | POA: Diagnosis not present

## 2014-06-05 DIAGNOSIS — M6281 Muscle weakness (generalized): Secondary | ICD-10-CM | POA: Diagnosis not present

## 2014-06-07 DIAGNOSIS — M25562 Pain in left knee: Secondary | ICD-10-CM | POA: Diagnosis not present

## 2014-06-07 DIAGNOSIS — M25662 Stiffness of left knee, not elsewhere classified: Secondary | ICD-10-CM | POA: Diagnosis not present

## 2014-06-07 DIAGNOSIS — R262 Difficulty in walking, not elsewhere classified: Secondary | ICD-10-CM | POA: Diagnosis not present

## 2014-06-07 DIAGNOSIS — M6281 Muscle weakness (generalized): Secondary | ICD-10-CM | POA: Diagnosis not present

## 2014-06-12 DIAGNOSIS — M6281 Muscle weakness (generalized): Secondary | ICD-10-CM | POA: Diagnosis not present

## 2014-06-12 DIAGNOSIS — M25562 Pain in left knee: Secondary | ICD-10-CM | POA: Diagnosis not present

## 2014-06-12 DIAGNOSIS — M25662 Stiffness of left knee, not elsewhere classified: Secondary | ICD-10-CM | POA: Diagnosis not present

## 2014-06-12 DIAGNOSIS — R262 Difficulty in walking, not elsewhere classified: Secondary | ICD-10-CM | POA: Diagnosis not present

## 2014-06-14 DIAGNOSIS — M6281 Muscle weakness (generalized): Secondary | ICD-10-CM | POA: Diagnosis not present

## 2014-06-14 DIAGNOSIS — M25662 Stiffness of left knee, not elsewhere classified: Secondary | ICD-10-CM | POA: Diagnosis not present

## 2014-06-14 DIAGNOSIS — R262 Difficulty in walking, not elsewhere classified: Secondary | ICD-10-CM | POA: Diagnosis not present

## 2014-06-14 DIAGNOSIS — M25562 Pain in left knee: Secondary | ICD-10-CM | POA: Diagnosis not present

## 2014-06-23 DIAGNOSIS — Z96652 Presence of left artificial knee joint: Secondary | ICD-10-CM | POA: Diagnosis not present

## 2014-06-23 DIAGNOSIS — M25661 Stiffness of right knee, not elsewhere classified: Secondary | ICD-10-CM | POA: Diagnosis not present

## 2014-06-23 DIAGNOSIS — M25562 Pain in left knee: Secondary | ICD-10-CM | POA: Diagnosis not present

## 2014-06-23 DIAGNOSIS — M6281 Muscle weakness (generalized): Secondary | ICD-10-CM | POA: Diagnosis not present

## 2014-06-29 DIAGNOSIS — Z96652 Presence of left artificial knee joint: Secondary | ICD-10-CM | POA: Diagnosis not present

## 2014-06-30 DIAGNOSIS — M6281 Muscle weakness (generalized): Secondary | ICD-10-CM | POA: Diagnosis not present

## 2014-06-30 DIAGNOSIS — M25562 Pain in left knee: Secondary | ICD-10-CM | POA: Diagnosis not present

## 2014-06-30 DIAGNOSIS — M25662 Stiffness of left knee, not elsewhere classified: Secondary | ICD-10-CM | POA: Diagnosis not present

## 2014-06-30 DIAGNOSIS — Z96652 Presence of left artificial knee joint: Secondary | ICD-10-CM | POA: Diagnosis not present

## 2014-07-03 DIAGNOSIS — M25562 Pain in left knee: Secondary | ICD-10-CM | POA: Diagnosis not present

## 2014-07-03 DIAGNOSIS — M6281 Muscle weakness (generalized): Secondary | ICD-10-CM | POA: Diagnosis not present

## 2014-07-03 DIAGNOSIS — R262 Difficulty in walking, not elsewhere classified: Secondary | ICD-10-CM | POA: Diagnosis not present

## 2014-07-03 DIAGNOSIS — M25662 Stiffness of left knee, not elsewhere classified: Secondary | ICD-10-CM | POA: Diagnosis not present

## 2014-07-05 DIAGNOSIS — M6281 Muscle weakness (generalized): Secondary | ICD-10-CM | POA: Diagnosis not present

## 2014-07-05 DIAGNOSIS — M25562 Pain in left knee: Secondary | ICD-10-CM | POA: Diagnosis not present

## 2014-07-05 DIAGNOSIS — M25662 Stiffness of left knee, not elsewhere classified: Secondary | ICD-10-CM | POA: Diagnosis not present

## 2014-07-05 DIAGNOSIS — R262 Difficulty in walking, not elsewhere classified: Secondary | ICD-10-CM | POA: Diagnosis not present

## 2014-07-14 DIAGNOSIS — M25662 Stiffness of left knee, not elsewhere classified: Secondary | ICD-10-CM | POA: Diagnosis not present

## 2014-07-14 DIAGNOSIS — M6281 Muscle weakness (generalized): Secondary | ICD-10-CM | POA: Diagnosis not present

## 2014-07-14 DIAGNOSIS — R262 Difficulty in walking, not elsewhere classified: Secondary | ICD-10-CM | POA: Diagnosis not present

## 2014-07-14 DIAGNOSIS — M25562 Pain in left knee: Secondary | ICD-10-CM | POA: Diagnosis not present

## 2014-07-21 DIAGNOSIS — R262 Difficulty in walking, not elsewhere classified: Secondary | ICD-10-CM | POA: Diagnosis not present

## 2014-07-21 DIAGNOSIS — M25562 Pain in left knee: Secondary | ICD-10-CM | POA: Diagnosis not present

## 2014-07-21 DIAGNOSIS — M6281 Muscle weakness (generalized): Secondary | ICD-10-CM | POA: Diagnosis not present

## 2014-07-21 DIAGNOSIS — M25662 Stiffness of left knee, not elsewhere classified: Secondary | ICD-10-CM | POA: Diagnosis not present

## 2014-07-28 DIAGNOSIS — M6281 Muscle weakness (generalized): Secondary | ICD-10-CM | POA: Diagnosis not present

## 2014-07-28 DIAGNOSIS — M25662 Stiffness of left knee, not elsewhere classified: Secondary | ICD-10-CM | POA: Diagnosis not present

## 2014-07-28 DIAGNOSIS — M25562 Pain in left knee: Secondary | ICD-10-CM | POA: Diagnosis not present

## 2014-07-28 DIAGNOSIS — R262 Difficulty in walking, not elsewhere classified: Secondary | ICD-10-CM | POA: Diagnosis not present

## 2014-08-01 DIAGNOSIS — M25562 Pain in left knee: Secondary | ICD-10-CM | POA: Diagnosis not present

## 2014-08-01 DIAGNOSIS — M25662 Stiffness of left knee, not elsewhere classified: Secondary | ICD-10-CM | POA: Diagnosis not present

## 2014-08-01 DIAGNOSIS — M6281 Muscle weakness (generalized): Secondary | ICD-10-CM | POA: Diagnosis not present

## 2014-08-04 DIAGNOSIS — M6281 Muscle weakness (generalized): Secondary | ICD-10-CM | POA: Diagnosis not present

## 2014-08-04 DIAGNOSIS — M25562 Pain in left knee: Secondary | ICD-10-CM | POA: Diagnosis not present

## 2014-08-04 DIAGNOSIS — R262 Difficulty in walking, not elsewhere classified: Secondary | ICD-10-CM | POA: Diagnosis not present

## 2014-08-04 DIAGNOSIS — M25662 Stiffness of left knee, not elsewhere classified: Secondary | ICD-10-CM | POA: Diagnosis not present

## 2014-08-11 DIAGNOSIS — M25562 Pain in left knee: Secondary | ICD-10-CM | POA: Diagnosis not present

## 2014-08-11 DIAGNOSIS — M6281 Muscle weakness (generalized): Secondary | ICD-10-CM | POA: Diagnosis not present

## 2014-08-11 DIAGNOSIS — M25662 Stiffness of left knee, not elsewhere classified: Secondary | ICD-10-CM | POA: Diagnosis not present

## 2014-08-11 DIAGNOSIS — R262 Difficulty in walking, not elsewhere classified: Secondary | ICD-10-CM | POA: Diagnosis not present

## 2014-08-18 DIAGNOSIS — R262 Difficulty in walking, not elsewhere classified: Secondary | ICD-10-CM | POA: Diagnosis not present

## 2014-08-18 DIAGNOSIS — M25662 Stiffness of left knee, not elsewhere classified: Secondary | ICD-10-CM | POA: Diagnosis not present

## 2014-08-18 DIAGNOSIS — M25562 Pain in left knee: Secondary | ICD-10-CM | POA: Diagnosis not present

## 2014-08-18 DIAGNOSIS — M6281 Muscle weakness (generalized): Secondary | ICD-10-CM | POA: Diagnosis not present

## 2014-09-18 DIAGNOSIS — M25562 Pain in left knee: Secondary | ICD-10-CM | POA: Diagnosis not present

## 2014-09-29 DIAGNOSIS — I10 Essential (primary) hypertension: Secondary | ICD-10-CM | POA: Diagnosis not present

## 2014-09-29 DIAGNOSIS — E119 Type 2 diabetes mellitus without complications: Secondary | ICD-10-CM | POA: Diagnosis not present

## 2014-09-29 DIAGNOSIS — Z780 Asymptomatic menopausal state: Secondary | ICD-10-CM | POA: Diagnosis not present

## 2014-09-29 DIAGNOSIS — M545 Low back pain: Secondary | ICD-10-CM | POA: Diagnosis not present

## 2014-09-29 DIAGNOSIS — R609 Edema, unspecified: Secondary | ICD-10-CM | POA: Diagnosis not present

## 2014-09-29 DIAGNOSIS — M109 Gout, unspecified: Secondary | ICD-10-CM | POA: Diagnosis not present

## 2014-09-29 DIAGNOSIS — E782 Mixed hyperlipidemia: Secondary | ICD-10-CM | POA: Diagnosis not present

## 2014-10-06 DIAGNOSIS — Z23 Encounter for immunization: Secondary | ICD-10-CM | POA: Diagnosis not present

## 2014-10-06 DIAGNOSIS — M109 Gout, unspecified: Secondary | ICD-10-CM | POA: Diagnosis not present

## 2014-10-06 DIAGNOSIS — Z1389 Encounter for screening for other disorder: Secondary | ICD-10-CM | POA: Diagnosis not present

## 2014-10-06 DIAGNOSIS — K219 Gastro-esophageal reflux disease without esophagitis: Secondary | ICD-10-CM | POA: Diagnosis not present

## 2014-10-06 DIAGNOSIS — R809 Proteinuria, unspecified: Secondary | ICD-10-CM | POA: Diagnosis not present

## 2014-10-06 DIAGNOSIS — Z0000 Encounter for general adult medical examination without abnormal findings: Secondary | ICD-10-CM | POA: Diagnosis not present

## 2014-10-06 DIAGNOSIS — E782 Mixed hyperlipidemia: Secondary | ICD-10-CM | POA: Diagnosis not present

## 2014-10-06 DIAGNOSIS — E1129 Type 2 diabetes mellitus with other diabetic kidney complication: Secondary | ICD-10-CM | POA: Diagnosis not present

## 2014-10-06 DIAGNOSIS — I10 Essential (primary) hypertension: Secondary | ICD-10-CM | POA: Diagnosis not present

## 2014-10-06 DIAGNOSIS — R609 Edema, unspecified: Secondary | ICD-10-CM | POA: Diagnosis not present

## 2014-10-07 DIAGNOSIS — R350 Frequency of micturition: Secondary | ICD-10-CM | POA: Diagnosis not present

## 2014-10-07 DIAGNOSIS — I10 Essential (primary) hypertension: Secondary | ICD-10-CM | POA: Diagnosis not present

## 2014-10-11 DIAGNOSIS — I10 Essential (primary) hypertension: Secondary | ICD-10-CM | POA: Diagnosis not present

## 2014-11-03 DIAGNOSIS — I10 Essential (primary) hypertension: Secondary | ICD-10-CM | POA: Diagnosis not present

## 2014-11-29 DIAGNOSIS — M25562 Pain in left knee: Secondary | ICD-10-CM | POA: Diagnosis not present

## 2014-11-29 DIAGNOSIS — M6281 Muscle weakness (generalized): Secondary | ICD-10-CM | POA: Diagnosis not present

## 2015-01-15 DIAGNOSIS — M25562 Pain in left knee: Secondary | ICD-10-CM | POA: Diagnosis not present

## 2015-02-26 DIAGNOSIS — M25562 Pain in left knee: Secondary | ICD-10-CM | POA: Diagnosis not present

## 2015-03-01 DIAGNOSIS — H04129 Dry eye syndrome of unspecified lacrimal gland: Secondary | ICD-10-CM | POA: Diagnosis not present

## 2015-03-01 DIAGNOSIS — H2511 Age-related nuclear cataract, right eye: Secondary | ICD-10-CM | POA: Diagnosis not present

## 2015-03-01 DIAGNOSIS — H538 Other visual disturbances: Secondary | ICD-10-CM | POA: Diagnosis not present

## 2015-03-22 DIAGNOSIS — R300 Dysuria: Secondary | ICD-10-CM | POA: Diagnosis not present

## 2015-03-22 DIAGNOSIS — J040 Acute laryngitis: Secondary | ICD-10-CM | POA: Diagnosis not present

## 2015-04-13 DIAGNOSIS — E1165 Type 2 diabetes mellitus with hyperglycemia: Secondary | ICD-10-CM | POA: Diagnosis not present

## 2015-04-13 DIAGNOSIS — E1129 Type 2 diabetes mellitus with other diabetic kidney complication: Secondary | ICD-10-CM | POA: Diagnosis not present

## 2015-04-13 DIAGNOSIS — I10 Essential (primary) hypertension: Secondary | ICD-10-CM | POA: Diagnosis not present

## 2015-04-20 ENCOUNTER — Other Ambulatory Visit: Payer: Self-pay | Admitting: Obstetrics and Gynecology

## 2015-04-20 ENCOUNTER — Other Ambulatory Visit (HOSPITAL_COMMUNITY)
Admission: RE | Admit: 2015-04-20 | Discharge: 2015-04-20 | Disposition: A | Discharge status: Home / Self Care | Payer: Medicare Other | Source: Ambulatory Visit | Attending: Obstetrics and Gynecology | Admitting: Obstetrics and Gynecology

## 2015-04-20 ENCOUNTER — Ambulatory Visit
Admission: RE | Admit: 2015-04-20 | Discharge: 2015-04-20 | Disposition: A | Discharge status: Home / Self Care | Payer: Medicare Other | Source: Ambulatory Visit

## 2015-04-20 DIAGNOSIS — Z1231 Encounter for screening mammogram for malignant neoplasm of breast: Secondary | ICD-10-CM | POA: Diagnosis not present

## 2015-04-20 DIAGNOSIS — Z780 Asymptomatic menopausal state: Secondary | ICD-10-CM | POA: Diagnosis not present

## 2015-04-20 DIAGNOSIS — M109 Gout, unspecified: Secondary | ICD-10-CM | POA: Diagnosis not present

## 2015-04-20 DIAGNOSIS — Z01419 Encounter for gynecological examination (general) (routine) without abnormal findings: Secondary | ICD-10-CM | POA: Diagnosis not present

## 2015-04-20 DIAGNOSIS — E782 Mixed hyperlipidemia: Secondary | ICD-10-CM | POA: Diagnosis not present

## 2015-04-20 DIAGNOSIS — R490 Dysphonia: Secondary | ICD-10-CM | POA: Diagnosis not present

## 2015-04-20 DIAGNOSIS — I10 Essential (primary) hypertension: Secondary | ICD-10-CM | POA: Diagnosis not present

## 2015-04-20 DIAGNOSIS — Z1151 Encounter for screening for human papillomavirus (HPV): Secondary | ICD-10-CM | POA: Diagnosis not present

## 2015-04-20 DIAGNOSIS — N393 Stress incontinence (female) (male): Secondary | ICD-10-CM | POA: Diagnosis not present

## 2015-04-20 DIAGNOSIS — Z01411 Encounter for gynecological examination (general) (routine) with abnormal findings: Secondary | ICD-10-CM | POA: Diagnosis not present

## 2015-04-20 DIAGNOSIS — R809 Proteinuria, unspecified: Secondary | ICD-10-CM | POA: Diagnosis not present

## 2015-04-20 DIAGNOSIS — K219 Gastro-esophageal reflux disease without esophagitis: Secondary | ICD-10-CM | POA: Diagnosis not present

## 2015-04-20 DIAGNOSIS — E1129 Type 2 diabetes mellitus with other diabetic kidney complication: Secondary | ICD-10-CM | POA: Diagnosis not present

## 2015-04-20 DIAGNOSIS — Z23 Encounter for immunization: Secondary | ICD-10-CM | POA: Diagnosis not present

## 2015-04-24 LAB — CYTOLOGY - PAP

## 2015-04-27 DIAGNOSIS — R490 Dysphonia: Secondary | ICD-10-CM | POA: Diagnosis not present

## 2015-04-27 DIAGNOSIS — J384 Edema of larynx: Secondary | ICD-10-CM | POA: Diagnosis not present

## 2015-05-07 DIAGNOSIS — N393 Stress incontinence (female) (male): Secondary | ICD-10-CM | POA: Diagnosis not present

## 2015-11-09 DIAGNOSIS — E782 Mixed hyperlipidemia: Secondary | ICD-10-CM | POA: Diagnosis not present

## 2015-11-09 DIAGNOSIS — R490 Dysphonia: Secondary | ICD-10-CM | POA: Diagnosis not present

## 2015-11-09 DIAGNOSIS — R809 Proteinuria, unspecified: Secondary | ICD-10-CM | POA: Diagnosis not present

## 2015-11-09 DIAGNOSIS — N393 Stress incontinence (female) (male): Secondary | ICD-10-CM | POA: Diagnosis not present

## 2015-11-09 DIAGNOSIS — Z01419 Encounter for gynecological examination (general) (routine) without abnormal findings: Secondary | ICD-10-CM | POA: Diagnosis not present

## 2015-11-09 DIAGNOSIS — K219 Gastro-esophageal reflux disease without esophagitis: Secondary | ICD-10-CM | POA: Diagnosis not present

## 2015-11-09 DIAGNOSIS — Z01411 Encounter for gynecological examination (general) (routine) with abnormal findings: Secondary | ICD-10-CM | POA: Diagnosis not present

## 2015-11-09 DIAGNOSIS — I10 Essential (primary) hypertension: Secondary | ICD-10-CM | POA: Diagnosis not present

## 2015-11-09 DIAGNOSIS — M109 Gout, unspecified: Secondary | ICD-10-CM | POA: Diagnosis not present

## 2015-11-09 DIAGNOSIS — E1129 Type 2 diabetes mellitus with other diabetic kidney complication: Secondary | ICD-10-CM | POA: Diagnosis not present

## 2015-11-14 DIAGNOSIS — R609 Edema, unspecified: Secondary | ICD-10-CM | POA: Diagnosis not present

## 2015-11-14 DIAGNOSIS — E1129 Type 2 diabetes mellitus with other diabetic kidney complication: Secondary | ICD-10-CM | POA: Diagnosis not present

## 2015-11-14 DIAGNOSIS — Z0000 Encounter for general adult medical examination without abnormal findings: Secondary | ICD-10-CM | POA: Diagnosis not present

## 2015-11-14 DIAGNOSIS — M109 Gout, unspecified: Secondary | ICD-10-CM | POA: Diagnosis not present

## 2015-11-14 DIAGNOSIS — Z1389 Encounter for screening for other disorder: Secondary | ICD-10-CM | POA: Diagnosis not present

## 2015-11-14 DIAGNOSIS — I10 Essential (primary) hypertension: Secondary | ICD-10-CM | POA: Diagnosis not present

## 2015-11-14 DIAGNOSIS — E782 Mixed hyperlipidemia: Secondary | ICD-10-CM | POA: Diagnosis not present

## 2015-11-14 DIAGNOSIS — K219 Gastro-esophageal reflux disease without esophagitis: Secondary | ICD-10-CM | POA: Diagnosis not present

## 2015-11-14 DIAGNOSIS — R809 Proteinuria, unspecified: Secondary | ICD-10-CM | POA: Diagnosis not present

## 2015-11-14 DIAGNOSIS — Z7189 Other specified counseling: Secondary | ICD-10-CM | POA: Diagnosis not present

## 2015-12-07 DIAGNOSIS — I10 Essential (primary) hypertension: Secondary | ICD-10-CM | POA: Diagnosis not present

## 2015-12-07 DIAGNOSIS — Z0000 Encounter for general adult medical examination without abnormal findings: Secondary | ICD-10-CM | POA: Diagnosis not present

## 2015-12-07 DIAGNOSIS — M109 Gout, unspecified: Secondary | ICD-10-CM | POA: Diagnosis not present

## 2015-12-07 DIAGNOSIS — Z7189 Other specified counseling: Secondary | ICD-10-CM | POA: Diagnosis not present

## 2015-12-07 DIAGNOSIS — R609 Edema, unspecified: Secondary | ICD-10-CM | POA: Diagnosis not present

## 2015-12-07 DIAGNOSIS — Z79899 Other long term (current) drug therapy: Secondary | ICD-10-CM | POA: Diagnosis not present

## 2015-12-07 DIAGNOSIS — R809 Proteinuria, unspecified: Secondary | ICD-10-CM | POA: Diagnosis not present

## 2015-12-07 DIAGNOSIS — Z1389 Encounter for screening for other disorder: Secondary | ICD-10-CM | POA: Diagnosis not present

## 2015-12-07 DIAGNOSIS — E782 Mixed hyperlipidemia: Secondary | ICD-10-CM | POA: Diagnosis not present

## 2015-12-07 DIAGNOSIS — E1129 Type 2 diabetes mellitus with other diabetic kidney complication: Secondary | ICD-10-CM | POA: Diagnosis not present

## 2015-12-17 DIAGNOSIS — E2839 Other primary ovarian failure: Secondary | ICD-10-CM | POA: Diagnosis not present

## 2016-02-15 DIAGNOSIS — H538 Other visual disturbances: Secondary | ICD-10-CM | POA: Diagnosis not present

## 2016-02-15 DIAGNOSIS — H04129 Dry eye syndrome of unspecified lacrimal gland: Secondary | ICD-10-CM | POA: Diagnosis not present

## 2016-02-15 DIAGNOSIS — H2511 Age-related nuclear cataract, right eye: Secondary | ICD-10-CM | POA: Diagnosis not present

## 2016-05-06 DIAGNOSIS — I10 Essential (primary) hypertension: Secondary | ICD-10-CM | POA: Diagnosis not present

## 2016-05-06 DIAGNOSIS — R809 Proteinuria, unspecified: Secondary | ICD-10-CM | POA: Diagnosis not present

## 2016-05-06 DIAGNOSIS — R05 Cough: Secondary | ICD-10-CM | POA: Diagnosis not present

## 2016-05-06 DIAGNOSIS — J069 Acute upper respiratory infection, unspecified: Secondary | ICD-10-CM | POA: Diagnosis not present

## 2016-05-06 DIAGNOSIS — E1129 Type 2 diabetes mellitus with other diabetic kidney complication: Secondary | ICD-10-CM | POA: Diagnosis not present

## 2016-05-12 DIAGNOSIS — M109 Gout, unspecified: Secondary | ICD-10-CM | POA: Diagnosis not present

## 2016-05-12 DIAGNOSIS — J209 Acute bronchitis, unspecified: Secondary | ICD-10-CM | POA: Diagnosis not present

## 2016-05-12 DIAGNOSIS — R809 Proteinuria, unspecified: Secondary | ICD-10-CM | POA: Diagnosis not present

## 2016-05-12 DIAGNOSIS — E782 Mixed hyperlipidemia: Secondary | ICD-10-CM | POA: Diagnosis not present

## 2016-05-12 DIAGNOSIS — I10 Essential (primary) hypertension: Secondary | ICD-10-CM | POA: Diagnosis not present

## 2016-05-12 DIAGNOSIS — E1129 Type 2 diabetes mellitus with other diabetic kidney complication: Secondary | ICD-10-CM | POA: Diagnosis not present

## 2016-05-12 DIAGNOSIS — M545 Low back pain: Secondary | ICD-10-CM | POA: Diagnosis not present

## 2016-06-17 ENCOUNTER — Ambulatory Visit
Admission: RE | Admit: 2016-06-17 | Discharge: 2016-06-17 | Disposition: A | Discharge status: Home / Self Care | Payer: Medicare HMO | Source: Ambulatory Visit | Attending: Family Medicine | Admitting: Family Medicine

## 2016-06-17 ENCOUNTER — Other Ambulatory Visit: Payer: Self-pay | Admitting: Family Medicine

## 2016-06-17 DIAGNOSIS — R059 Cough, unspecified: Secondary | ICD-10-CM

## 2016-06-17 DIAGNOSIS — R05 Cough: Secondary | ICD-10-CM

## 2016-06-17 IMAGING — CR DG CHEST 2V
2 series · 2 of 2 positions shown · non-contrast
Comparison: [DATE].

CLINICAL DATA: Cough for 6 weeks

EXAM:
CHEST  2 VIEW

[w chest pa]
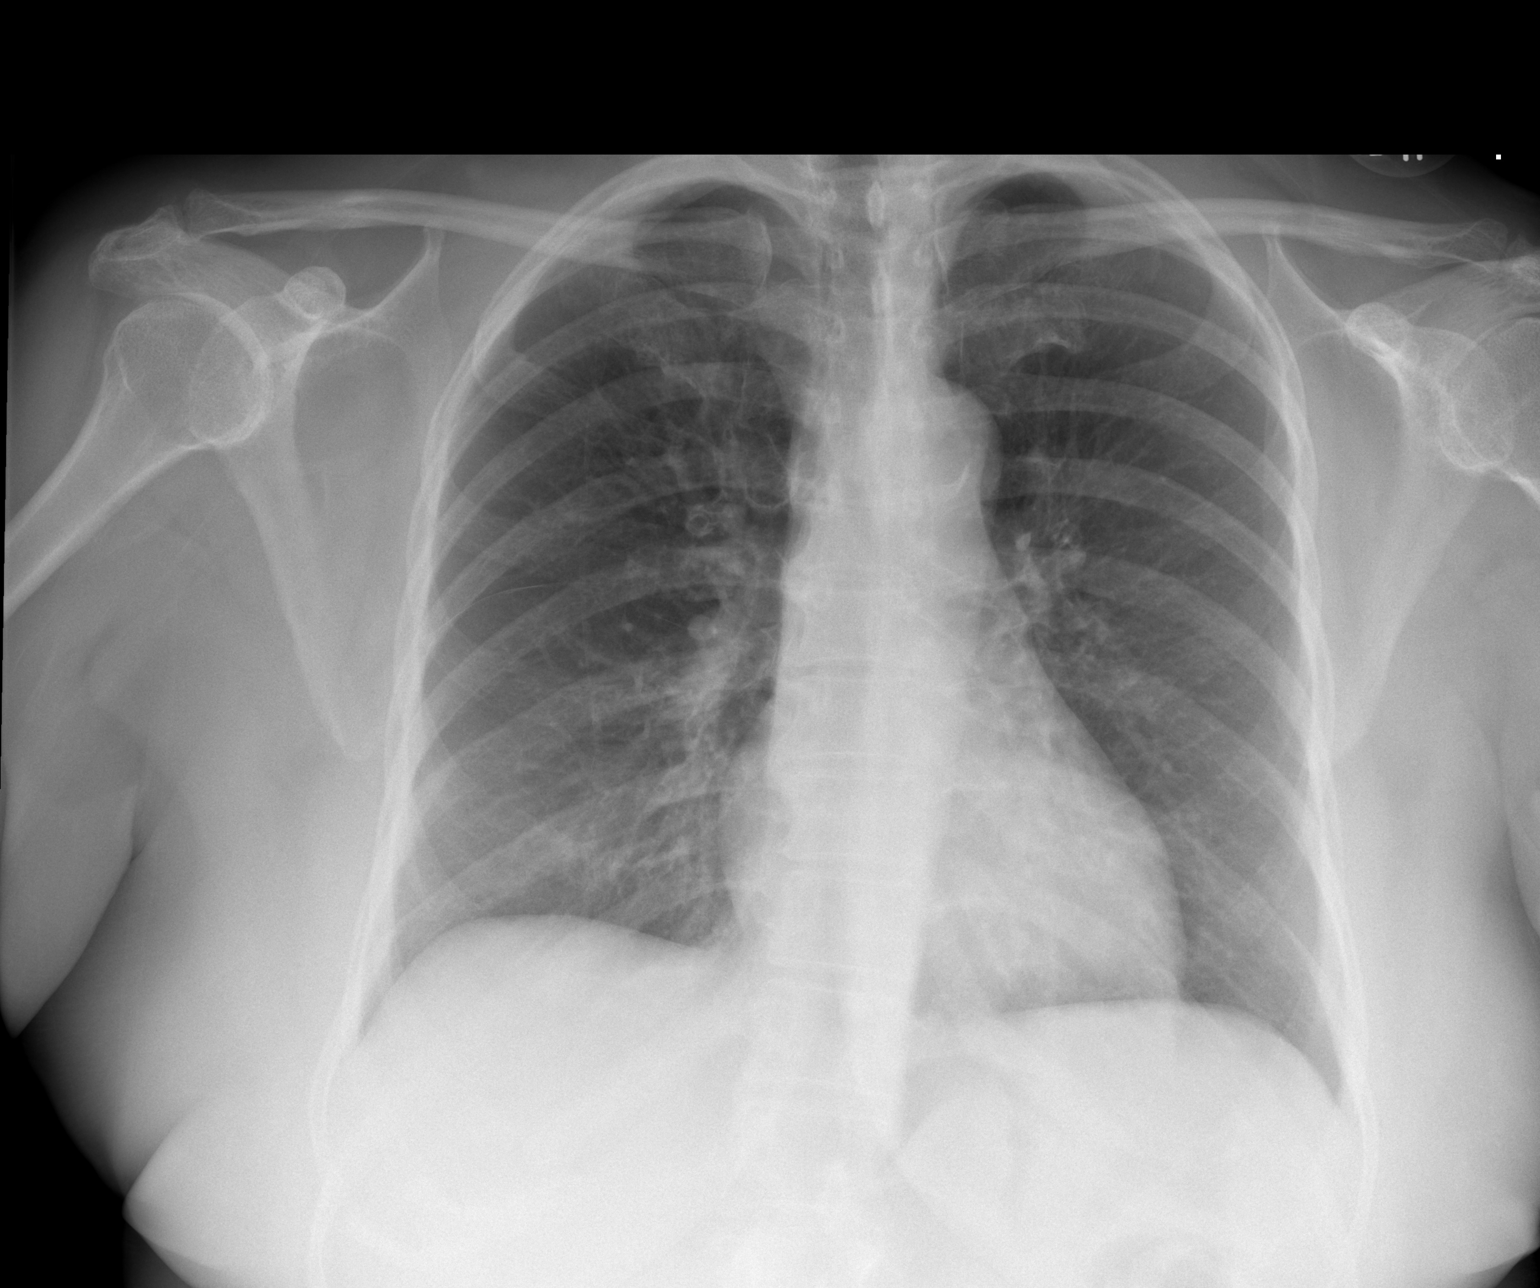

[w chest lat]
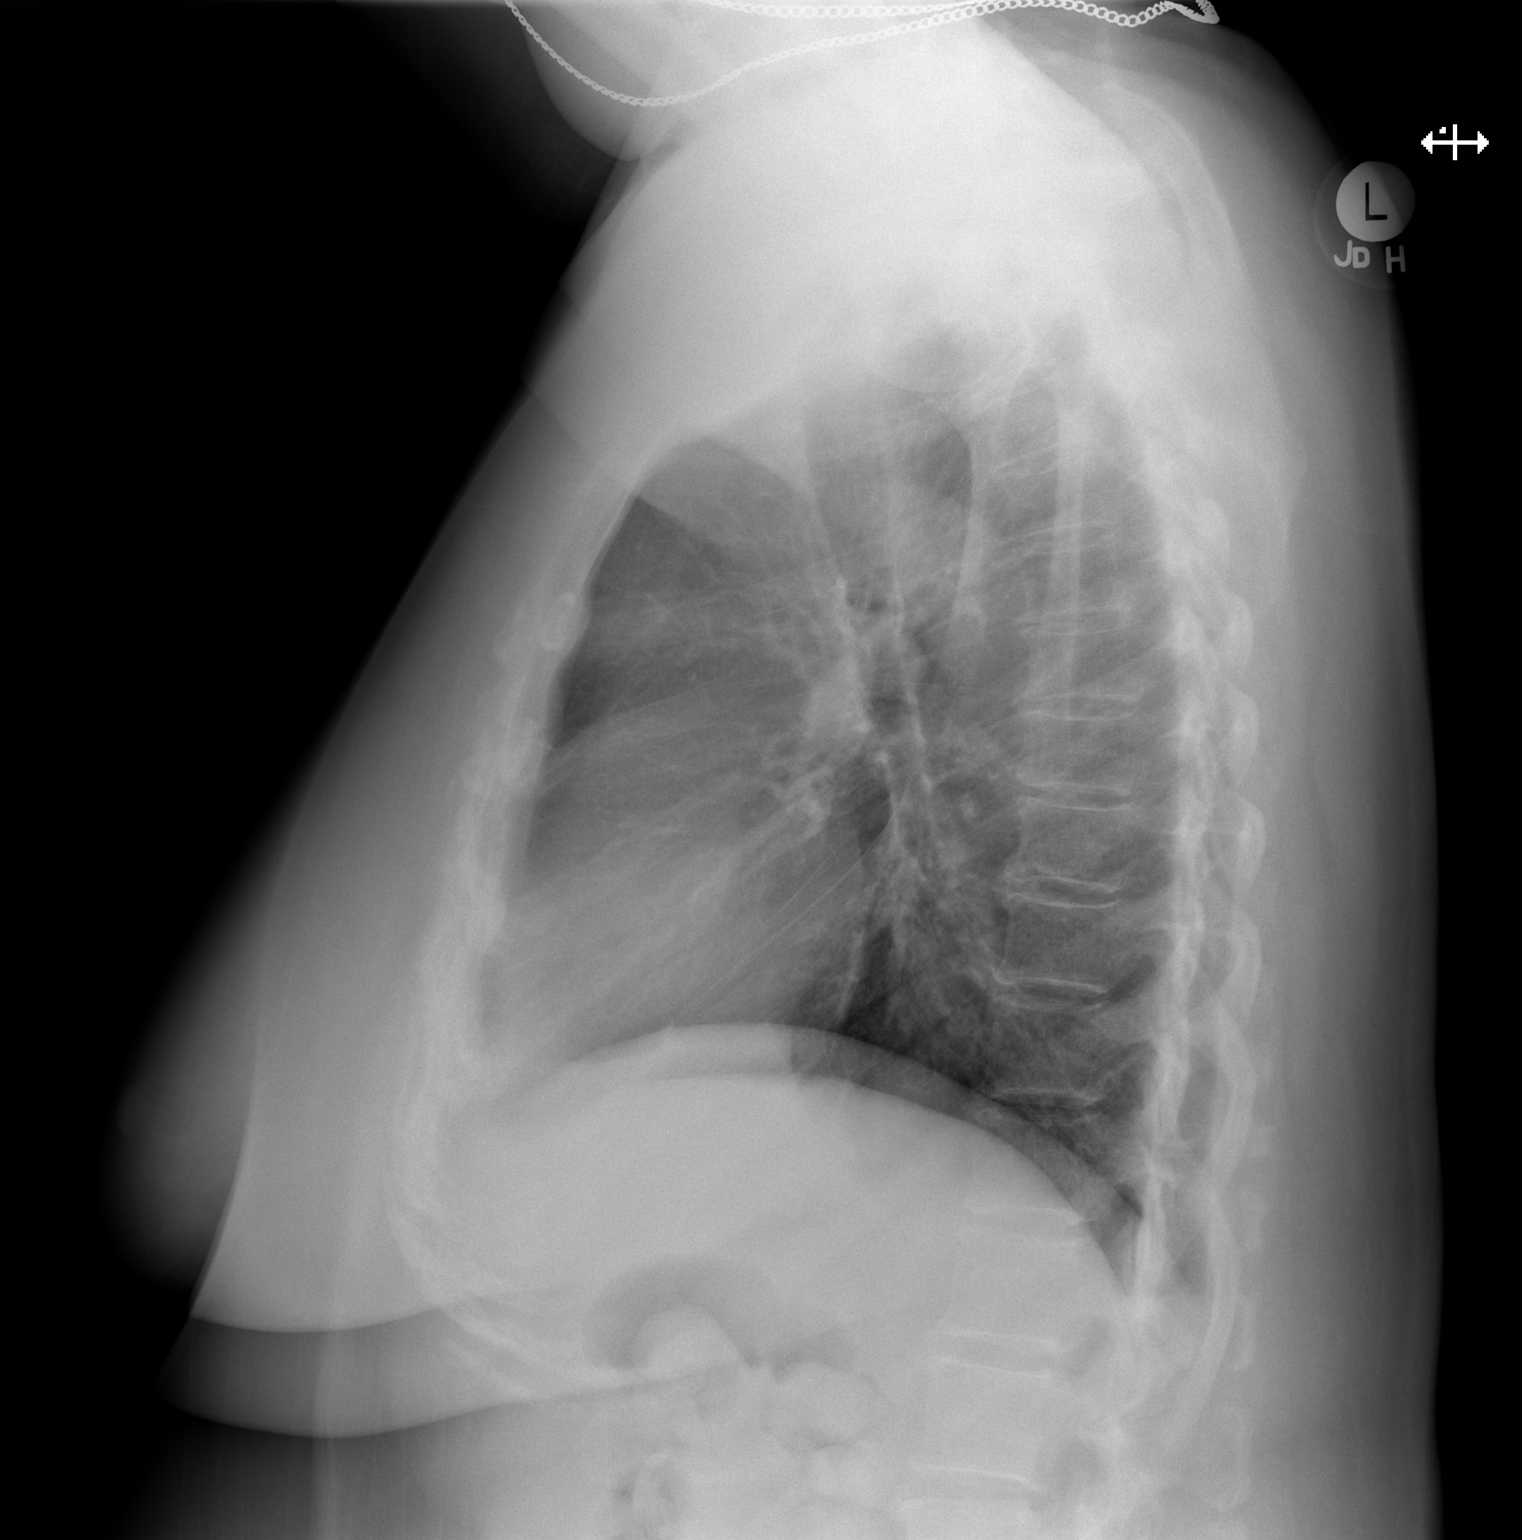

[2 of 2 positions shown; findings below may reference images not displayed]

FINDINGS: There is no edema or consolidation. Heart size and pulmonary
vascularity are normal. No adenopathy. There is atherosclerotic
calcification in the aorta.
IMPRESSION: No edema or consolidation.  There is aortic atherosclerosis.

## 2016-07-28 DIAGNOSIS — J069 Acute upper respiratory infection, unspecified: Secondary | ICD-10-CM | POA: Diagnosis not present

## 2016-09-25 DIAGNOSIS — H16102 Unspecified superficial keratitis, left eye: Secondary | ICD-10-CM | POA: Diagnosis not present

## 2016-09-25 DIAGNOSIS — H1012 Acute atopic conjunctivitis, left eye: Secondary | ICD-10-CM | POA: Diagnosis not present

## 2016-10-17 DIAGNOSIS — H04129 Dry eye syndrome of unspecified lacrimal gland: Secondary | ICD-10-CM | POA: Diagnosis not present

## 2016-10-17 DIAGNOSIS — H538 Other visual disturbances: Secondary | ICD-10-CM | POA: Diagnosis not present

## 2016-10-17 DIAGNOSIS — H2511 Age-related nuclear cataract, right eye: Secondary | ICD-10-CM | POA: Diagnosis not present

## 2016-10-23 DIAGNOSIS — Z0101 Encounter for examination of eyes and vision with abnormal findings: Secondary | ICD-10-CM | POA: Diagnosis not present

## 2016-12-12 DIAGNOSIS — M109 Gout, unspecified: Secondary | ICD-10-CM | POA: Diagnosis not present

## 2016-12-12 DIAGNOSIS — E1129 Type 2 diabetes mellitus with other diabetic kidney complication: Secondary | ICD-10-CM | POA: Diagnosis not present

## 2016-12-12 DIAGNOSIS — M545 Low back pain: Secondary | ICD-10-CM | POA: Diagnosis not present

## 2016-12-12 DIAGNOSIS — R809 Proteinuria, unspecified: Secondary | ICD-10-CM | POA: Diagnosis not present

## 2016-12-12 DIAGNOSIS — E782 Mixed hyperlipidemia: Secondary | ICD-10-CM | POA: Diagnosis not present

## 2016-12-12 DIAGNOSIS — J209 Acute bronchitis, unspecified: Secondary | ICD-10-CM | POA: Diagnosis not present

## 2016-12-12 DIAGNOSIS — I10 Essential (primary) hypertension: Secondary | ICD-10-CM | POA: Diagnosis not present

## 2016-12-16 DIAGNOSIS — E782 Mixed hyperlipidemia: Secondary | ICD-10-CM | POA: Diagnosis not present

## 2016-12-16 DIAGNOSIS — E1129 Type 2 diabetes mellitus with other diabetic kidney complication: Secondary | ICD-10-CM | POA: Diagnosis not present

## 2016-12-16 DIAGNOSIS — Z0000 Encounter for general adult medical examination without abnormal findings: Secondary | ICD-10-CM | POA: Diagnosis not present

## 2016-12-16 DIAGNOSIS — M545 Low back pain: Secondary | ICD-10-CM | POA: Diagnosis not present

## 2016-12-16 DIAGNOSIS — K219 Gastro-esophageal reflux disease without esophagitis: Secondary | ICD-10-CM | POA: Diagnosis not present

## 2016-12-16 DIAGNOSIS — M109 Gout, unspecified: Secondary | ICD-10-CM | POA: Diagnosis not present

## 2016-12-16 DIAGNOSIS — I10 Essential (primary) hypertension: Secondary | ICD-10-CM | POA: Diagnosis not present

## 2016-12-16 DIAGNOSIS — R809 Proteinuria, unspecified: Secondary | ICD-10-CM | POA: Diagnosis not present

## 2016-12-16 DIAGNOSIS — Z7189 Other specified counseling: Secondary | ICD-10-CM | POA: Diagnosis not present

## 2016-12-16 DIAGNOSIS — Z1389 Encounter for screening for other disorder: Secondary | ICD-10-CM | POA: Diagnosis not present

## 2016-12-23 DIAGNOSIS — Z1211 Encounter for screening for malignant neoplasm of colon: Secondary | ICD-10-CM | POA: Diagnosis not present

## 2016-12-29 DIAGNOSIS — M545 Low back pain: Secondary | ICD-10-CM | POA: Diagnosis not present

## 2017-01-02 DIAGNOSIS — M545 Low back pain: Secondary | ICD-10-CM | POA: Diagnosis not present

## 2017-01-09 DIAGNOSIS — M545 Low back pain: Secondary | ICD-10-CM | POA: Diagnosis not present

## 2017-01-29 DIAGNOSIS — R69 Illness, unspecified: Secondary | ICD-10-CM | POA: Diagnosis not present

## 2017-04-16 ENCOUNTER — Other Ambulatory Visit: Payer: Self-pay | Admitting: Obstetrics and Gynecology

## 2017-04-16 DIAGNOSIS — Z1231 Encounter for screening mammogram for malignant neoplasm of breast: Secondary | ICD-10-CM

## 2017-04-24 ENCOUNTER — Ambulatory Visit
Admission: RE | Admit: 2017-04-24 | Discharge: 2017-04-24 | Disposition: A | Discharge status: Home / Self Care | Payer: Medicare HMO | Source: Ambulatory Visit | Attending: Obstetrics and Gynecology | Admitting: Obstetrics and Gynecology

## 2017-04-24 DIAGNOSIS — Z1231 Encounter for screening mammogram for malignant neoplasm of breast: Secondary | ICD-10-CM

## 2017-04-24 IMAGING — MG 2D DIGITAL SCREENING BILATERAL MAMMOGRAM WITH CAD AND ADJUNCT TO
8 of 12 series · 8 of 28 positions shown · non-contrast
Comparison: Previous exam(s).

CLINICAL DATA: Screening.

EXAM:
2D DIGITAL SCREENING BILATERAL MAMMOGRAM WITH CAD AND ADJUNCT TOMO

[L MLO synth-2D]
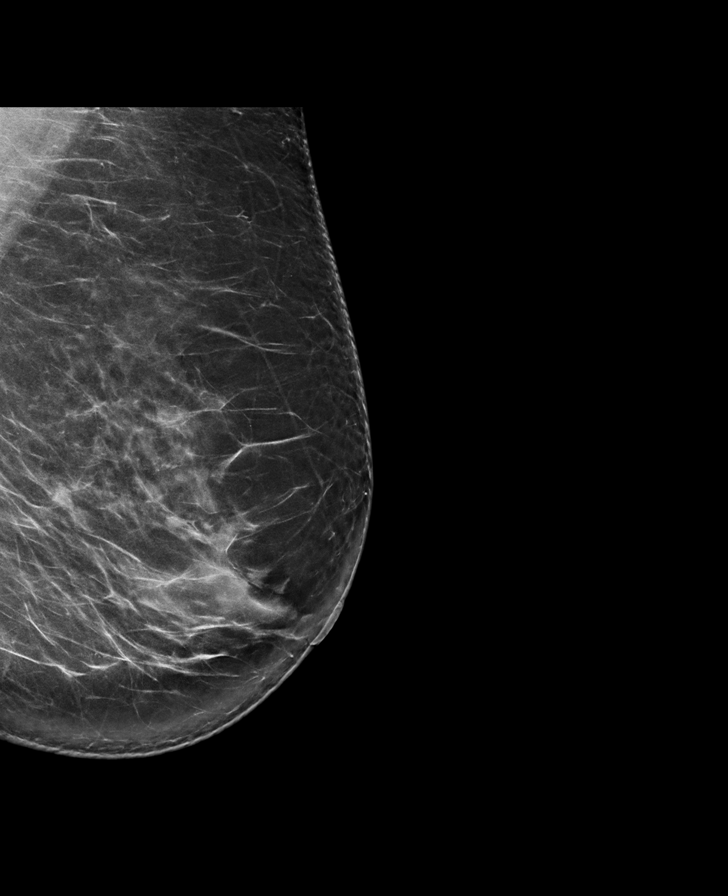

[L CC]
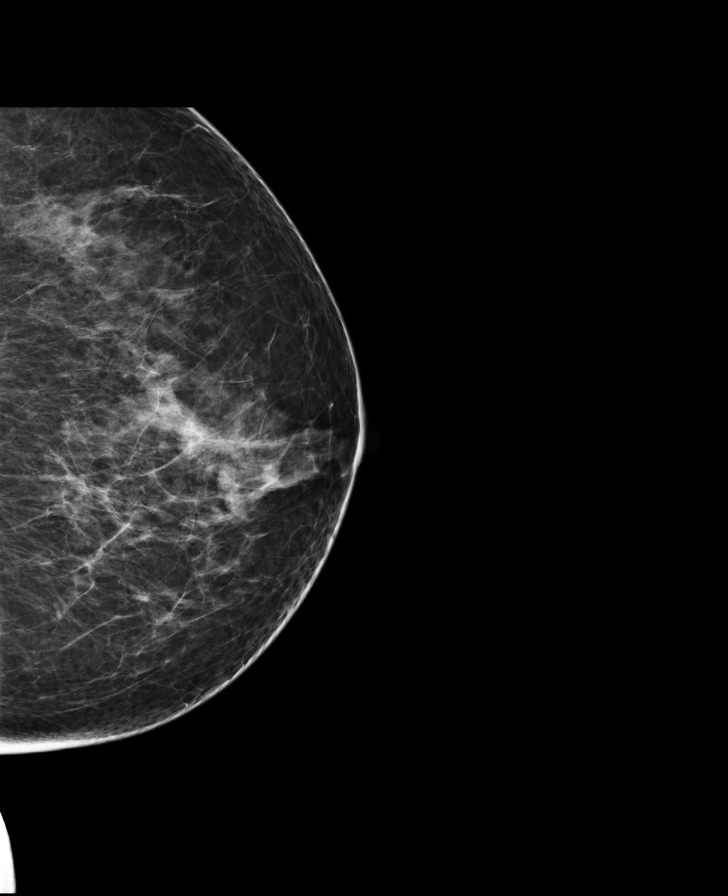

[R CC]
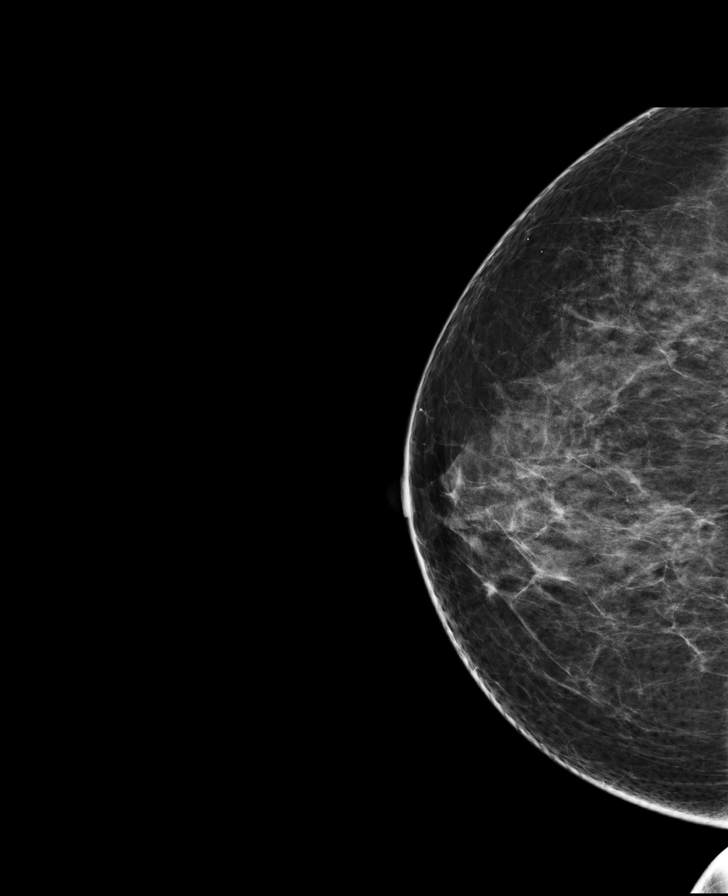

[R MLO synth-2D]
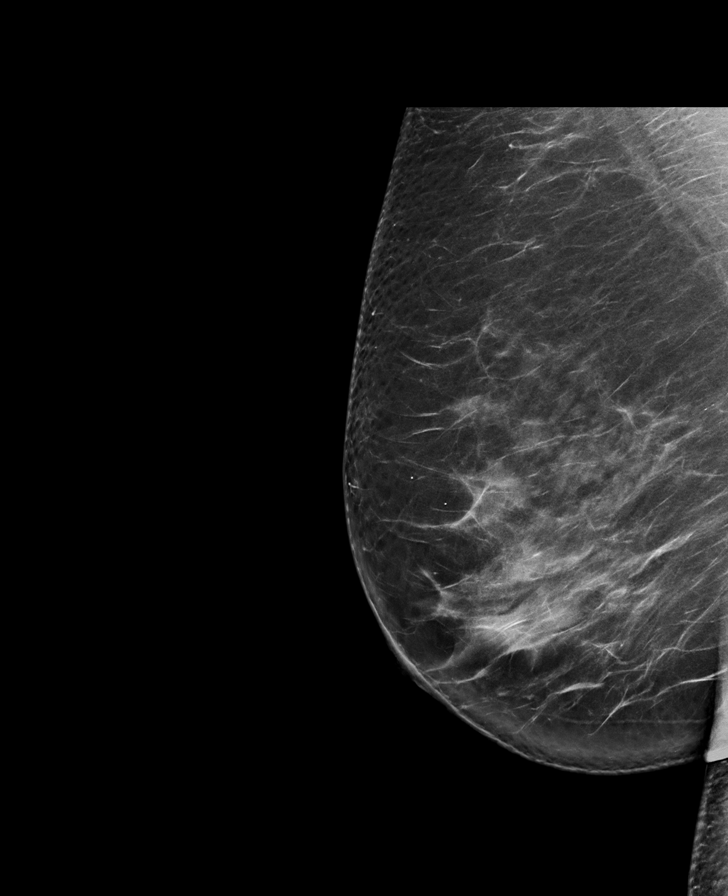

[L MLO]
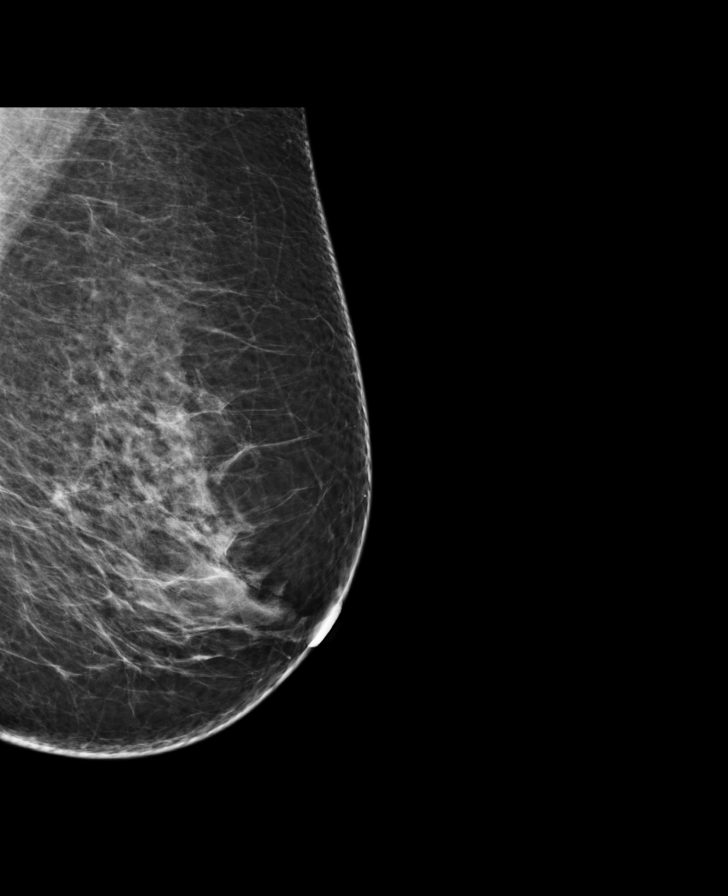

[R MLO]
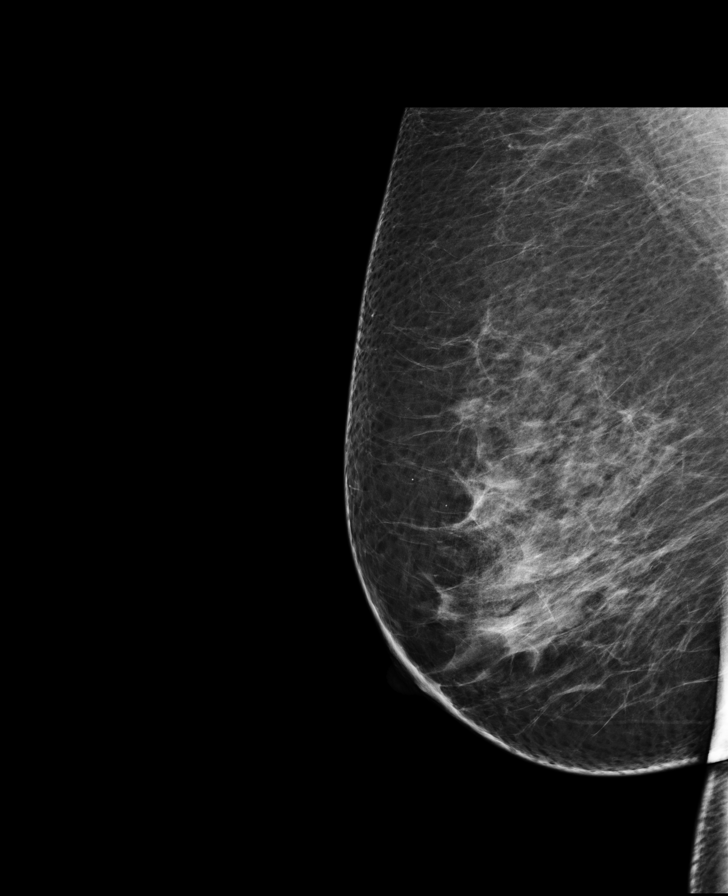

[L CC synth-2D]
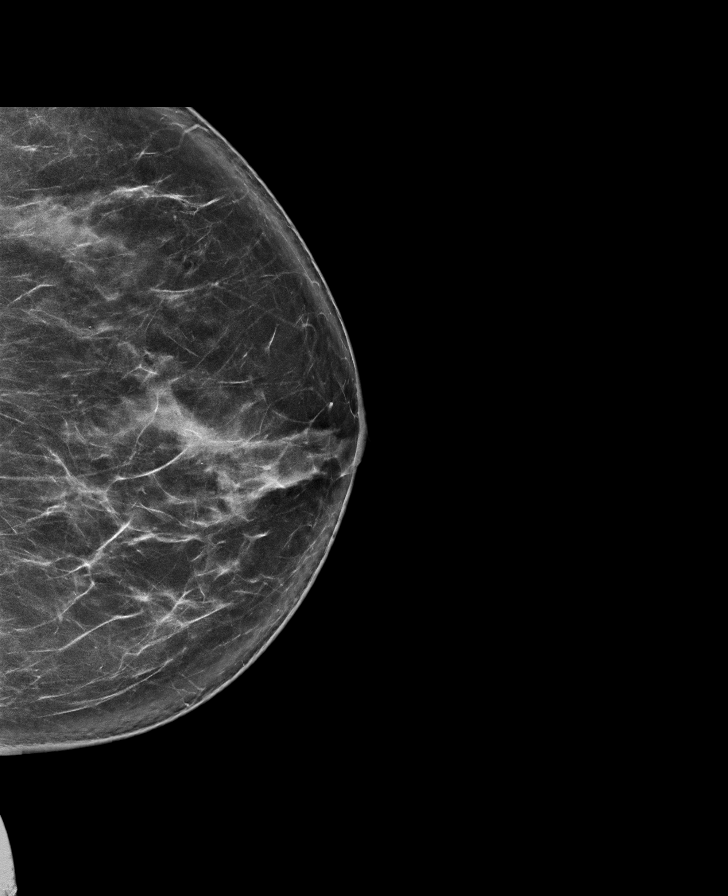

[R CC synth-2D]
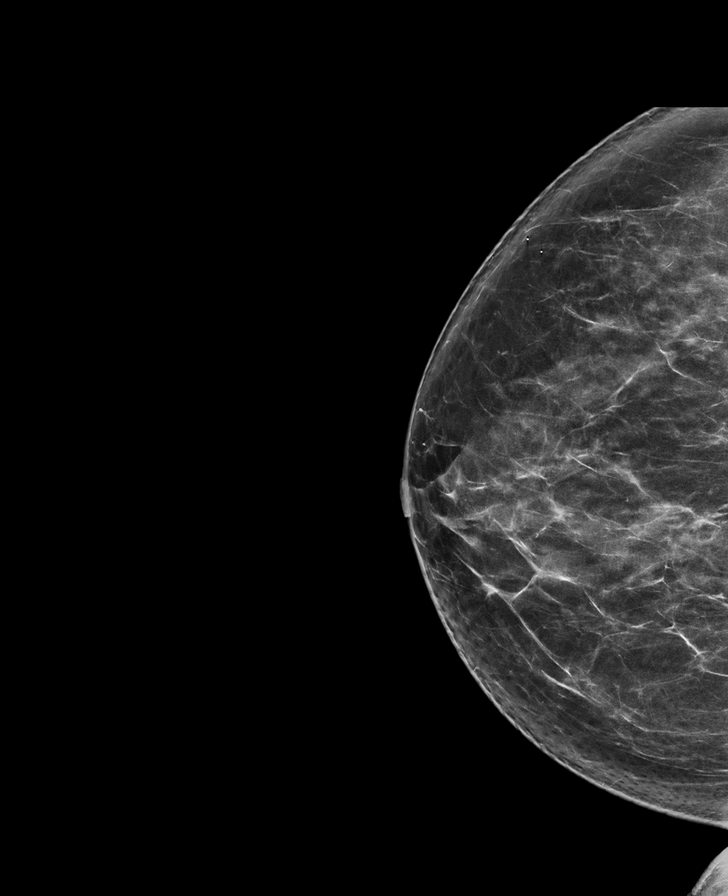

[8 of 28 positions shown; findings below may reference images not displayed]

ACR Breast Density Category b: There are scattered areas of
fibroglandular density.
FINDINGS: There are no findings suspicious for malignancy. Images were
processed with CAD.
IMPRESSION: No mammographic evidence of malignancy. A result letter of this
screening mammogram will be mailed directly to the patient.

RECOMMENDATION:
Screening mammogram in one year. (Code:[33])

BI-RADS CATEGORY  1: Negative.

## 2017-05-01 DIAGNOSIS — R69 Illness, unspecified: Secondary | ICD-10-CM | POA: Diagnosis not present

## 2017-05-05 ENCOUNTER — Other Ambulatory Visit: Payer: Self-pay | Admitting: Obstetrics and Gynecology

## 2017-05-05 DIAGNOSIS — N904 Leukoplakia of vulva: Secondary | ICD-10-CM | POA: Diagnosis not present

## 2017-05-05 DIAGNOSIS — Z01411 Encounter for gynecological examination (general) (routine) with abnormal findings: Secondary | ICD-10-CM | POA: Diagnosis not present

## 2017-05-05 DIAGNOSIS — L292 Pruritus vulvae: Secondary | ICD-10-CM | POA: Diagnosis not present

## 2017-05-05 DIAGNOSIS — R69 Illness, unspecified: Secondary | ICD-10-CM | POA: Diagnosis not present

## 2017-05-05 DIAGNOSIS — L280 Lichen simplex chronicus: Secondary | ICD-10-CM | POA: Diagnosis not present

## 2017-05-15 DIAGNOSIS — Z23 Encounter for immunization: Secondary | ICD-10-CM | POA: Diagnosis not present

## 2017-06-25 DIAGNOSIS — E1129 Type 2 diabetes mellitus with other diabetic kidney complication: Secondary | ICD-10-CM | POA: Diagnosis not present

## 2017-06-25 DIAGNOSIS — Z7984 Long term (current) use of oral hypoglycemic drugs: Secondary | ICD-10-CM | POA: Diagnosis not present

## 2017-06-26 DIAGNOSIS — L430 Hypertrophic lichen planus: Secondary | ICD-10-CM | POA: Diagnosis not present

## 2017-06-26 DIAGNOSIS — M109 Gout, unspecified: Secondary | ICD-10-CM | POA: Diagnosis not present

## 2017-06-26 DIAGNOSIS — M545 Low back pain: Secondary | ICD-10-CM | POA: Diagnosis not present

## 2017-06-26 DIAGNOSIS — Z1159 Encounter for screening for other viral diseases: Secondary | ICD-10-CM | POA: Diagnosis not present

## 2017-06-26 DIAGNOSIS — K219 Gastro-esophageal reflux disease without esophagitis: Secondary | ICD-10-CM | POA: Diagnosis not present

## 2017-06-26 DIAGNOSIS — I10 Essential (primary) hypertension: Secondary | ICD-10-CM | POA: Diagnosis not present

## 2017-06-26 DIAGNOSIS — R7303 Prediabetes: Secondary | ICD-10-CM | POA: Diagnosis not present

## 2017-06-26 DIAGNOSIS — E782 Mixed hyperlipidemia: Secondary | ICD-10-CM | POA: Diagnosis not present

## 2017-06-26 DIAGNOSIS — R69 Illness, unspecified: Secondary | ICD-10-CM | POA: Diagnosis not present

## 2017-08-05 DIAGNOSIS — J209 Acute bronchitis, unspecified: Secondary | ICD-10-CM | POA: Diagnosis not present

## 2017-09-07 DIAGNOSIS — R69 Illness, unspecified: Secondary | ICD-10-CM | POA: Diagnosis not present

## 2017-10-30 DIAGNOSIS — H538 Other visual disturbances: Secondary | ICD-10-CM | POA: Diagnosis not present

## 2017-10-30 DIAGNOSIS — H2511 Age-related nuclear cataract, right eye: Secondary | ICD-10-CM | POA: Diagnosis not present

## 2017-10-30 DIAGNOSIS — H04129 Dry eye syndrome of unspecified lacrimal gland: Secondary | ICD-10-CM | POA: Diagnosis not present

## 2018-01-04 DIAGNOSIS — E782 Mixed hyperlipidemia: Secondary | ICD-10-CM | POA: Diagnosis not present

## 2018-01-04 DIAGNOSIS — K219 Gastro-esophageal reflux disease without esophagitis: Secondary | ICD-10-CM | POA: Diagnosis not present

## 2018-01-04 DIAGNOSIS — Z1159 Encounter for screening for other viral diseases: Secondary | ICD-10-CM | POA: Diagnosis not present

## 2018-01-04 DIAGNOSIS — I10 Essential (primary) hypertension: Secondary | ICD-10-CM | POA: Diagnosis not present

## 2018-01-04 DIAGNOSIS — M109 Gout, unspecified: Secondary | ICD-10-CM | POA: Diagnosis not present

## 2018-01-04 DIAGNOSIS — R7303 Prediabetes: Secondary | ICD-10-CM | POA: Diagnosis not present

## 2018-01-04 DIAGNOSIS — R69 Illness, unspecified: Secondary | ICD-10-CM | POA: Diagnosis not present

## 2018-01-04 DIAGNOSIS — M545 Low back pain: Secondary | ICD-10-CM | POA: Diagnosis not present

## 2018-01-07 DIAGNOSIS — M109 Gout, unspecified: Secondary | ICD-10-CM | POA: Diagnosis not present

## 2018-01-07 DIAGNOSIS — I10 Essential (primary) hypertension: Secondary | ICD-10-CM | POA: Diagnosis not present

## 2018-01-07 DIAGNOSIS — E782 Mixed hyperlipidemia: Secondary | ICD-10-CM | POA: Diagnosis not present

## 2018-01-07 DIAGNOSIS — R809 Proteinuria, unspecified: Secondary | ICD-10-CM | POA: Diagnosis not present

## 2018-01-07 DIAGNOSIS — K219 Gastro-esophageal reflux disease without esophagitis: Secondary | ICD-10-CM | POA: Diagnosis not present

## 2018-01-07 DIAGNOSIS — Z7189 Other specified counseling: Secondary | ICD-10-CM | POA: Diagnosis not present

## 2018-01-07 DIAGNOSIS — Z0000 Encounter for general adult medical examination without abnormal findings: Secondary | ICD-10-CM | POA: Diagnosis not present

## 2018-01-07 DIAGNOSIS — M7061 Trochanteric bursitis, right hip: Secondary | ICD-10-CM | POA: Diagnosis not present

## 2018-01-07 DIAGNOSIS — Z1211 Encounter for screening for malignant neoplasm of colon: Secondary | ICD-10-CM | POA: Diagnosis not present

## 2018-01-07 DIAGNOSIS — E1129 Type 2 diabetes mellitus with other diabetic kidney complication: Secondary | ICD-10-CM | POA: Diagnosis not present

## 2018-01-07 DIAGNOSIS — Z1389 Encounter for screening for other disorder: Secondary | ICD-10-CM | POA: Diagnosis not present

## 2018-04-12 ENCOUNTER — Other Ambulatory Visit: Payer: Self-pay | Admitting: Obstetrics and Gynecology

## 2018-04-12 DIAGNOSIS — Z1231 Encounter for screening mammogram for malignant neoplasm of breast: Secondary | ICD-10-CM

## 2018-05-06 ENCOUNTER — Ambulatory Visit
Admission: RE | Admit: 2018-05-06 | Discharge: 2018-05-06 | Disposition: A | Discharge status: Home / Self Care | Payer: Medicare HMO | Source: Ambulatory Visit | Attending: Obstetrics and Gynecology | Admitting: Obstetrics and Gynecology

## 2018-05-06 DIAGNOSIS — Z1231 Encounter for screening mammogram for malignant neoplasm of breast: Secondary | ICD-10-CM

## 2018-05-06 IMAGING — MG DIGITAL SCREENING BILATERAL MAMMOGRAM WITH TOMO AND CAD
8 series · 8 of 24 positions shown · non-contrast
Comparison: Previous exam(s).

CLINICAL DATA: Screening.

EXAM:
DIGITAL SCREENING BILATERAL MAMMOGRAM WITH TOMO AND CAD

[L MLO synth-2D]
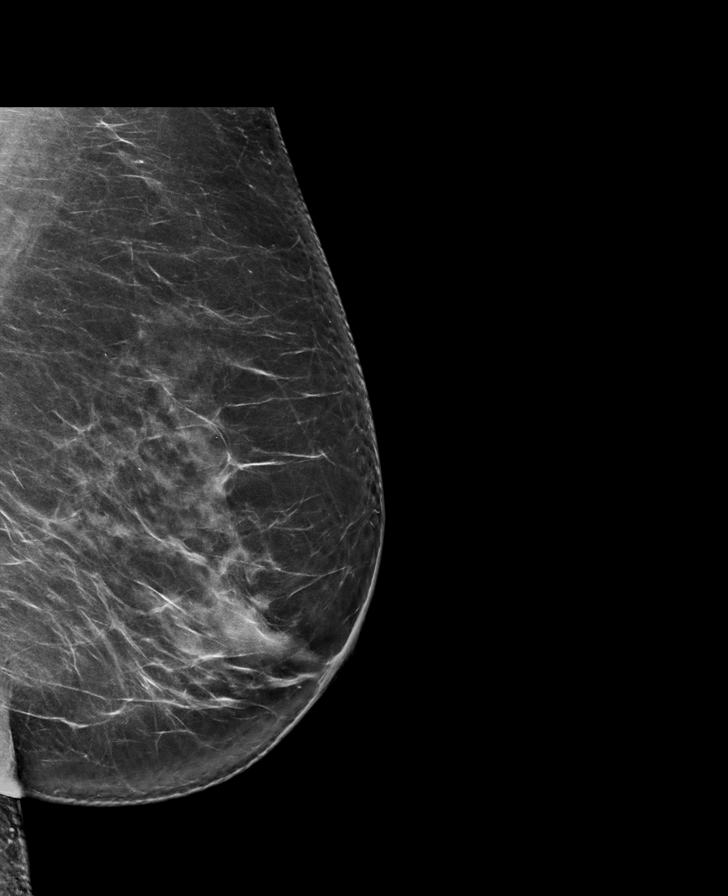

[R MLO synth-2D]
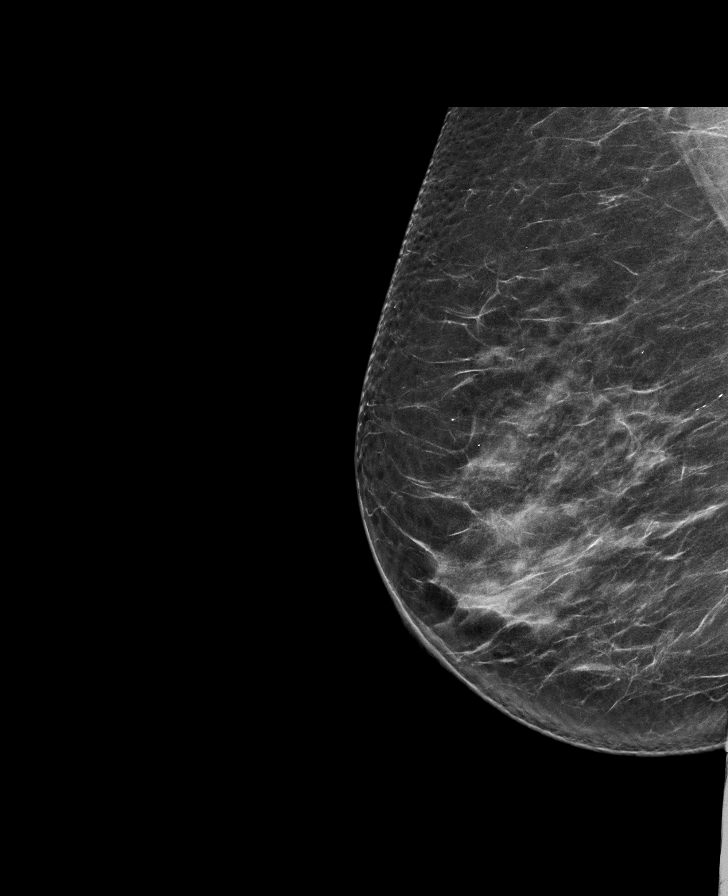

[L CC synth-2D]
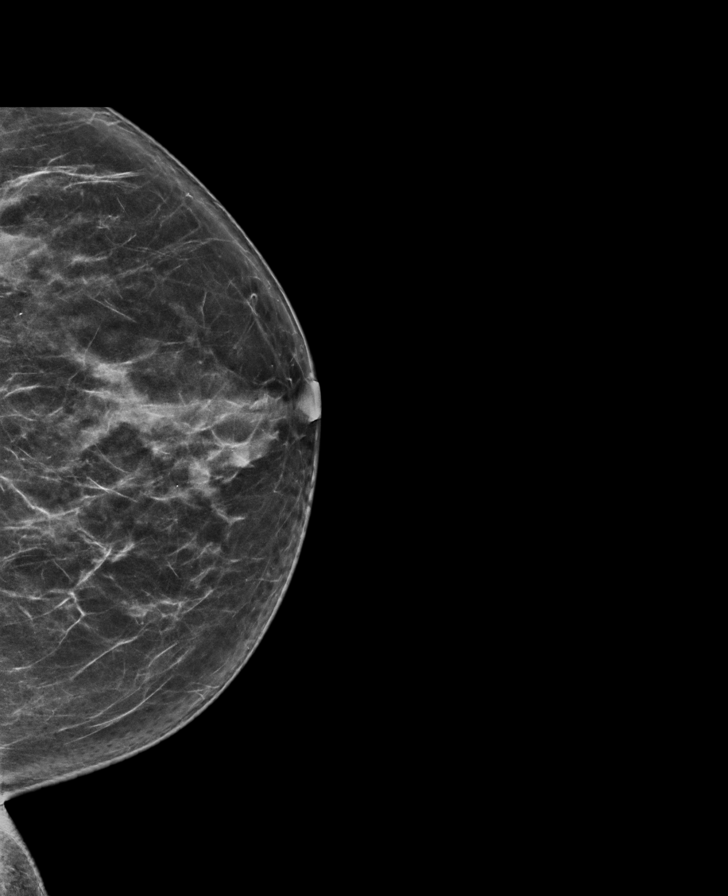

[R CC synth-2D]
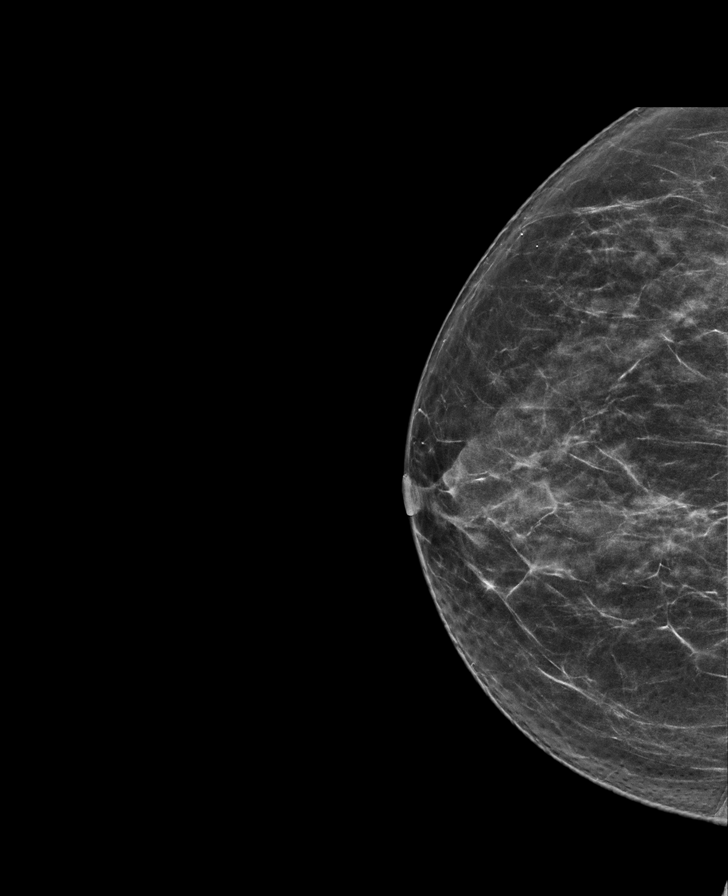

[R MLO tomo · tomo slice 41/80.0]
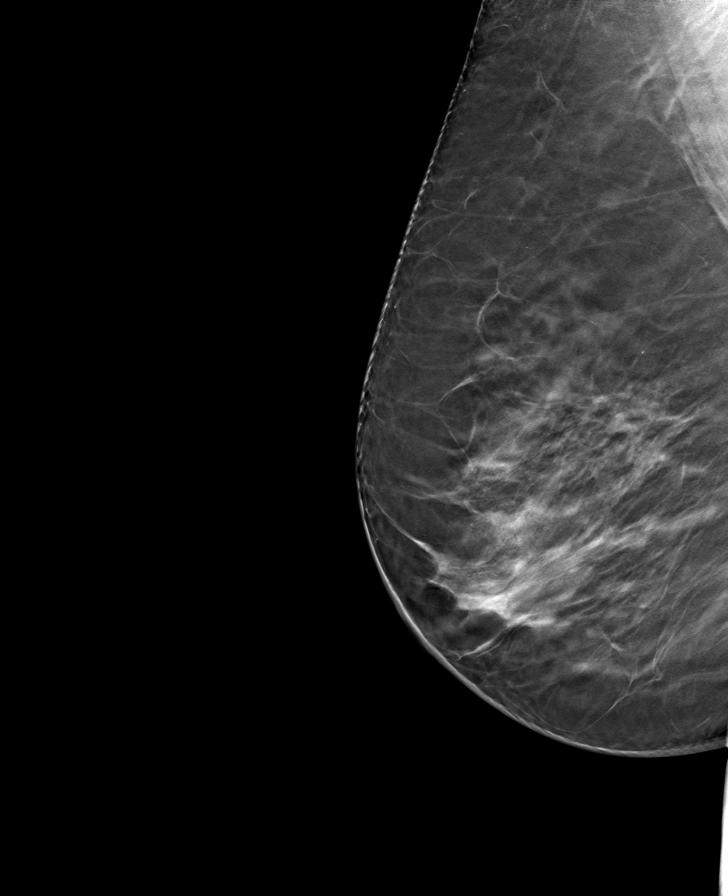

[L CC tomo · tomo slice 37/72.0]
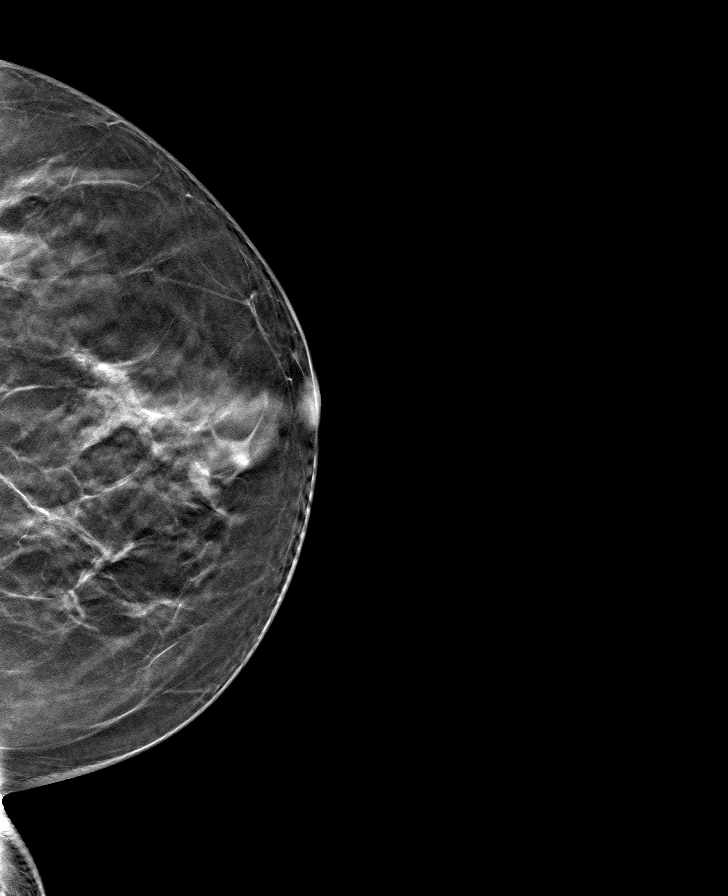

[L MLO tomo · tomo slice 45/88.0]
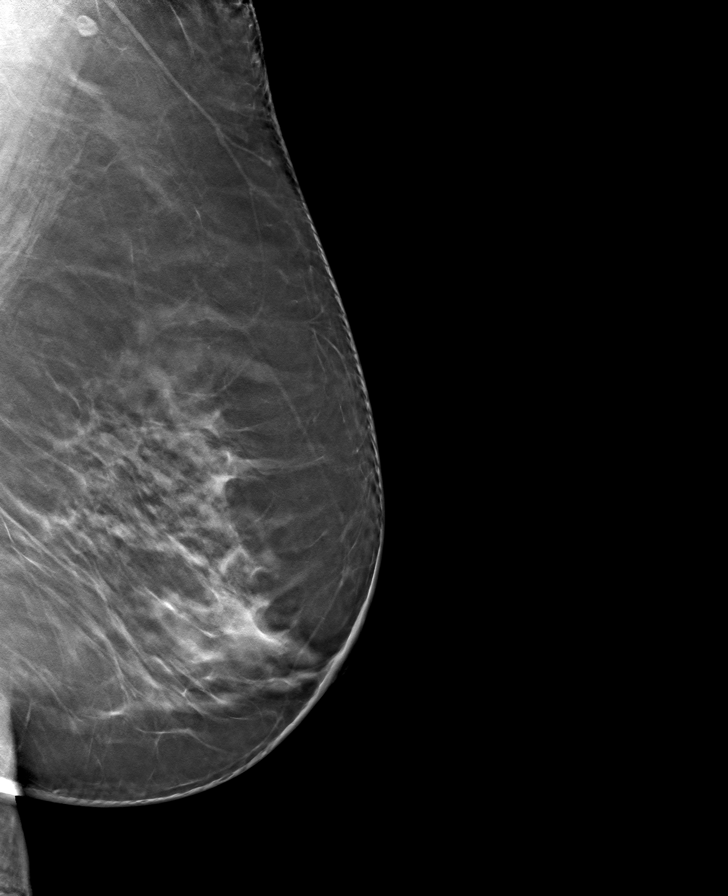

[R CC tomo · tomo slice 35/69.0]
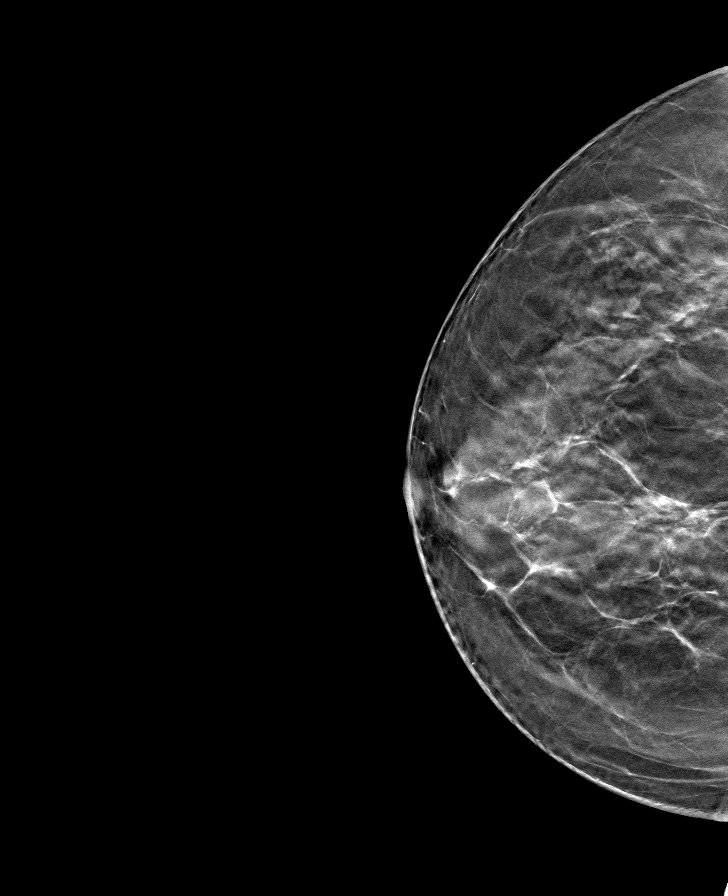

[8 of 24 positions shown; findings below may reference images not displayed]

ACR Breast Density Category c: The breast tissue is heterogeneously
dense, which may obscure small masses.
FINDINGS: There are no findings suspicious for malignancy. Images were
processed with CAD.
IMPRESSION: No mammographic evidence of malignancy. A result letter of this
screening mammogram will be mailed directly to the patient.

RECOMMENDATION:
Screening mammogram in one year. (Code:[5V])

BI-RADS CATEGORY  1: Negative.

## 2018-05-10 DIAGNOSIS — L439 Lichen planus, unspecified: Secondary | ICD-10-CM | POA: Diagnosis not present

## 2018-05-11 ENCOUNTER — Ambulatory Visit: Payer: Medicare HMO

## 2019-03-31 ENCOUNTER — Other Ambulatory Visit: Payer: Self-pay | Admitting: Obstetrics and Gynecology

## 2019-03-31 DIAGNOSIS — Z1231 Encounter for screening mammogram for malignant neoplasm of breast: Secondary | ICD-10-CM

## 2019-05-19 ENCOUNTER — Ambulatory Visit: Payer: Medicare HMO

## 2019-07-06 ENCOUNTER — Ambulatory Visit: Payer: Self-pay

## 2019-07-08 ENCOUNTER — Ambulatory Visit: Payer: Medicare Other | Attending: Internal Medicine

## 2019-07-08 DIAGNOSIS — Z23 Encounter for immunization: Secondary | ICD-10-CM | POA: Insufficient documentation

## 2019-07-08 NOTE — Progress Notes (Signed)
   Covid-19 Vaccination Clinic  Name:  Raelan Burgoon    MRN: [id number] DOB: 1946-03-12  07/08/2019  Ms. Morais was observed post Covid-19 immunization for 15 minutes without incidence. She was provided with Vaccine Information Sheet and instruction to access the V-Safe system.   Ms. Buckle was instructed to call 911 with any severe reactions post vaccine: Marland Kitchen Difficulty breathing  . Swelling of your face and throat  . A fast heartbeat  . A bad rash all over your body  . Dizziness and weakness    Immunizations Administered    Name Date Dose VIS Date Route   Pfizer COVID-19 Vaccine 07/08/2019  2:43 PM 0.3 mL 05/27/2019 Intramuscular   Manufacturer: [employer], [employer]   Lot: [id number]   NDC: [address]-4

## 2019-07-26 ENCOUNTER — Ambulatory Visit: Payer: Medicare Other | Attending: Internal Medicine

## 2019-07-26 DIAGNOSIS — Z23 Encounter for immunization: Secondary | ICD-10-CM | POA: Insufficient documentation

## 2019-07-26 NOTE — Progress Notes (Signed)
   Covid-19 Vaccination Clinic  Name:  Yaneth Fairbairn    MRN: [id number] DOB: 10-29-1945  07/26/2019  Ms. Muhlenkamp was observed post Covid-19 immunization for 15 minutes without incidence. She was provided with Vaccine Information Sheet and instruction to access the V-Safe system.   Ms. Nunley was instructed to call 911 with any severe reactions post vaccine: Marland Kitchen Difficulty breathing  . Swelling of your face and throat  . A fast heartbeat  . A bad rash all over your body  . Dizziness and weakness    Immunizations Administered    Name Date Dose VIS Date Route   Pfizer COVID-19 Vaccine 07/26/2019  9:07 AM 0.3 mL 05/27/2019 Intramuscular   Manufacturer: [employer], [employer]   Lot: [id number]   NDC: [address]-9

## 2020-01-23 ENCOUNTER — Other Ambulatory Visit: Payer: Self-pay | Admitting: Family Medicine

## 2020-01-23 DIAGNOSIS — Z1231 Encounter for screening mammogram for malignant neoplasm of breast: Secondary | ICD-10-CM

## 2020-01-24 ENCOUNTER — Other Ambulatory Visit (HOSPITAL_COMMUNITY): Payer: Self-pay | Admitting: Family Medicine

## 2020-01-24 ENCOUNTER — Other Ambulatory Visit: Payer: Self-pay

## 2020-01-24 ENCOUNTER — Ambulatory Visit (HOSPITAL_COMMUNITY)
Admission: RE | Admit: 2020-01-24 | Discharge: 2020-01-24 | Disposition: A | Discharge status: Home / Self Care | Payer: Medicare Other | Source: Ambulatory Visit | Attending: Cardiovascular Disease | Admitting: Cardiovascular Disease

## 2020-01-24 DIAGNOSIS — R0989 Other specified symptoms and signs involving the circulatory and respiratory systems: Secondary | ICD-10-CM | POA: Insufficient documentation

## 2020-02-03 ENCOUNTER — Other Ambulatory Visit: Payer: Self-pay

## 2020-02-03 ENCOUNTER — Ambulatory Visit
Admission: RE | Admit: 2020-02-03 | Discharge: 2020-02-03 | Disposition: A | Discharge status: Home / Self Care | Payer: Medicare Other | Source: Ambulatory Visit | Attending: Obstetrics and Gynecology | Admitting: Obstetrics and Gynecology

## 2020-02-03 DIAGNOSIS — Z1231 Encounter for screening mammogram for malignant neoplasm of breast: Secondary | ICD-10-CM

## 2020-02-03 IMAGING — MG DIGITAL SCREENING BILAT W/ TOMO W/ CAD
8 series · 8 of 24 positions shown · non-contrast
Comparison: Previous exam(s).

CLINICAL DATA: Screening.

EXAM:
DIGITAL SCREENING BILATERAL MAMMOGRAM WITH TOMO AND CAD

[R CC synth-2D]
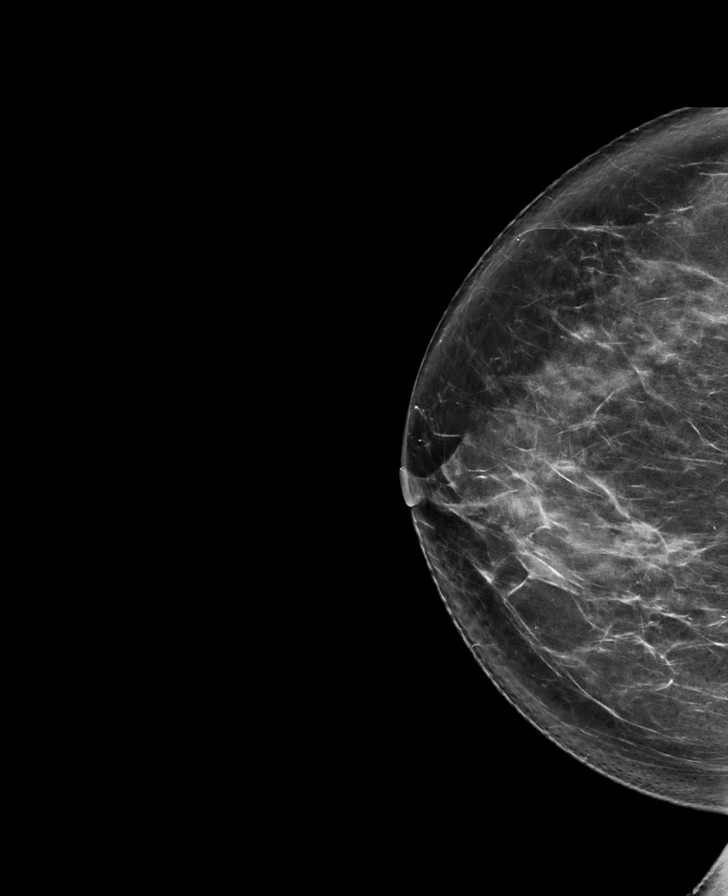

[L MLO synth-2D]
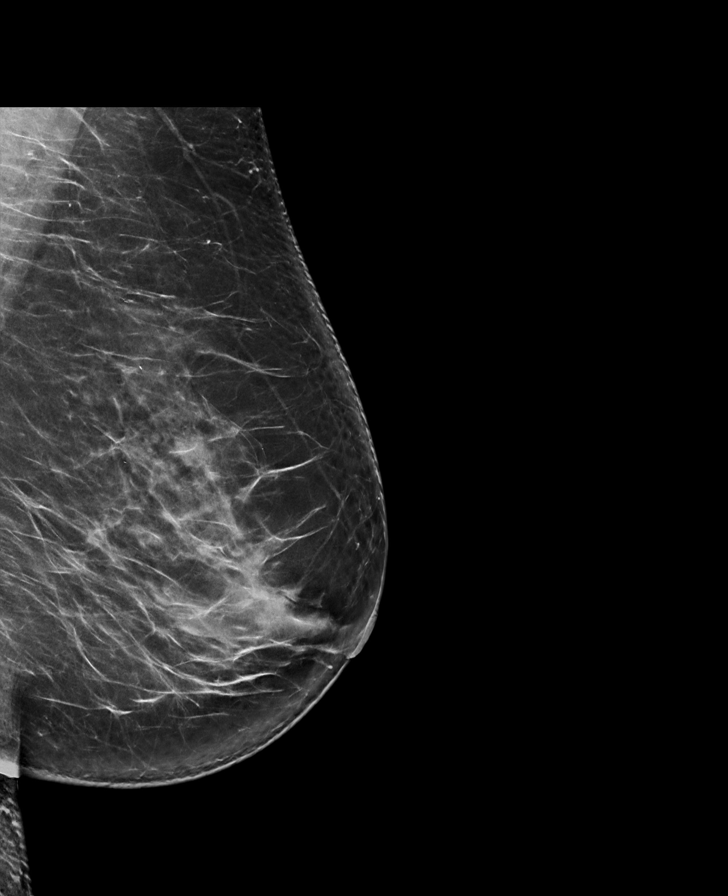

[L CC synth-2D]
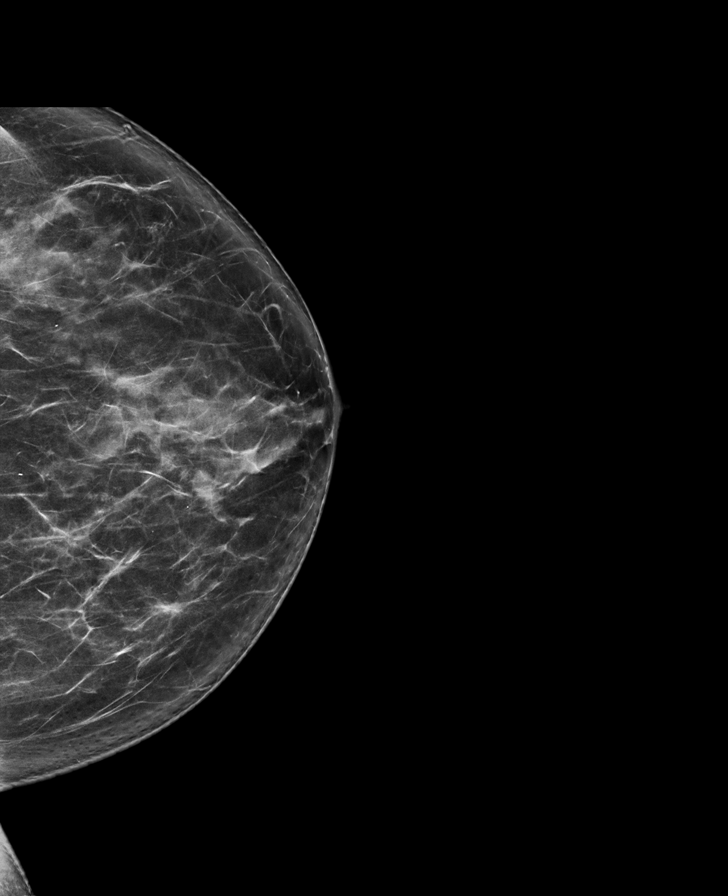

[R MLO synth-2D]
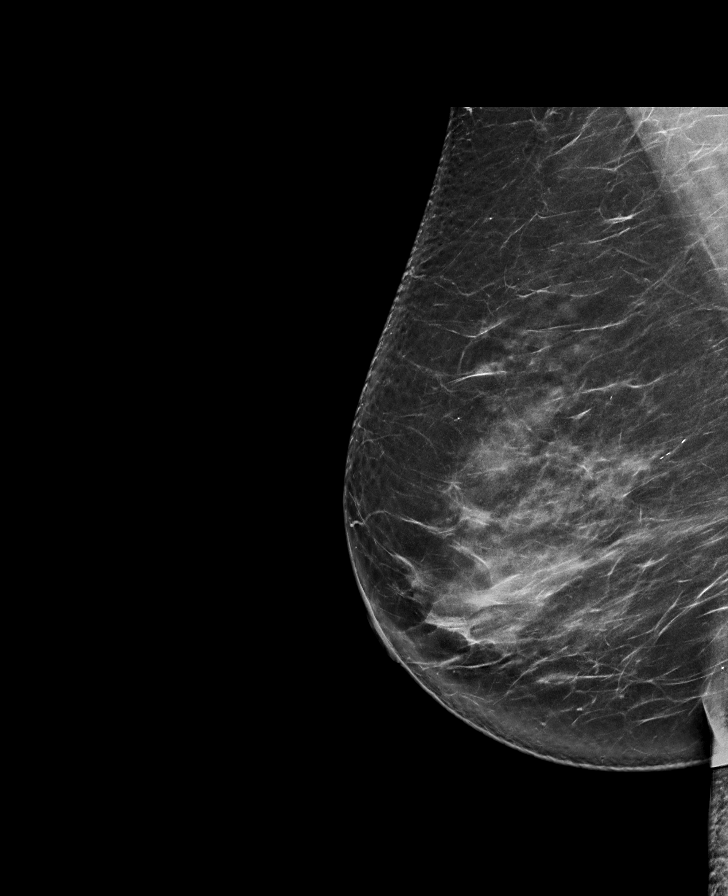

[R MLO tomo · tomo slice 45/89.0]
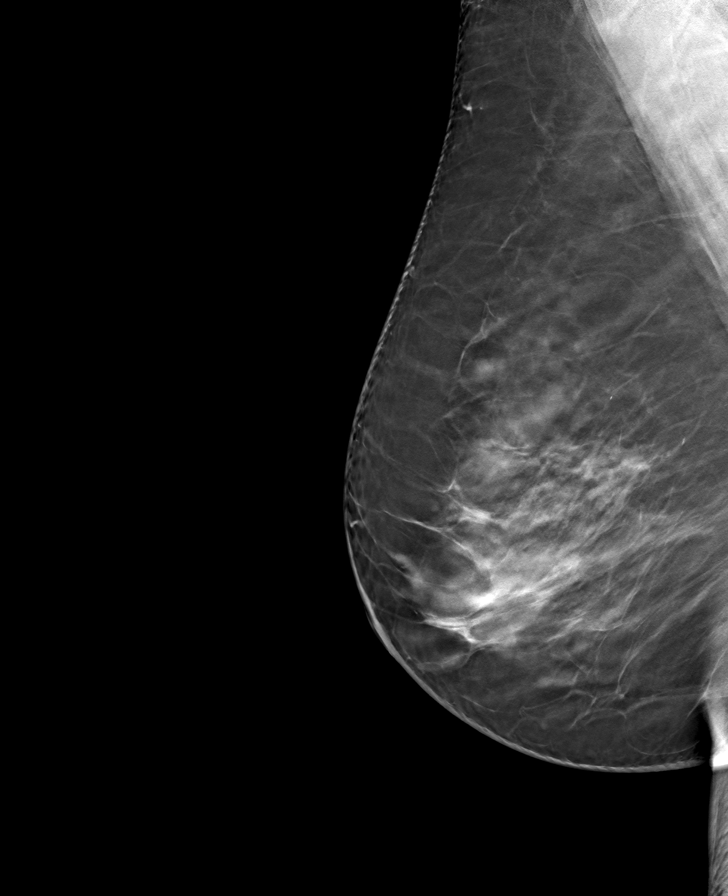

[R CC tomo · tomo slice 42/83.0]
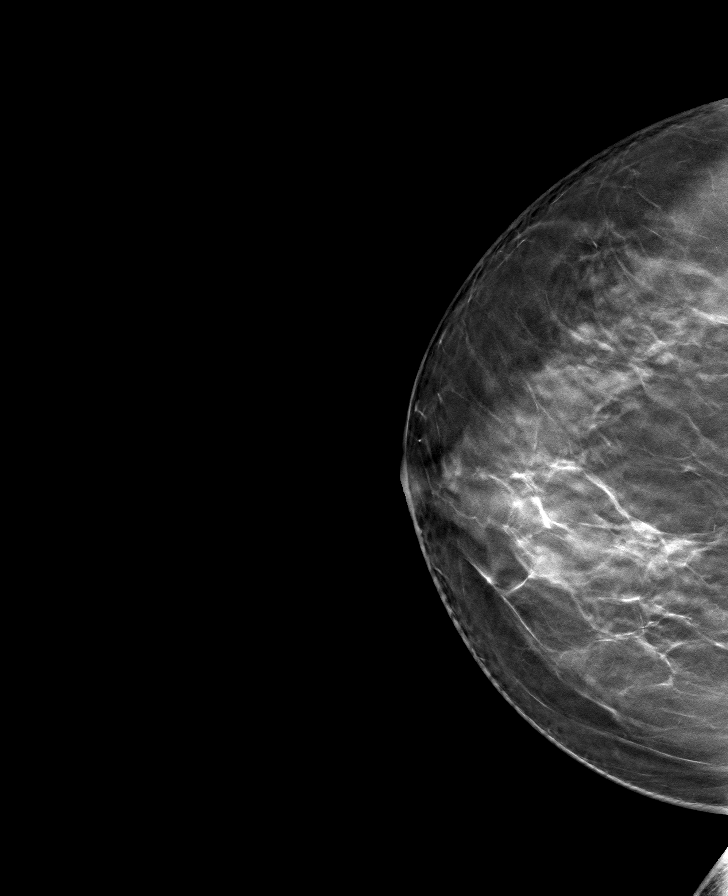

[L CC tomo · tomo slice 41/82.0]
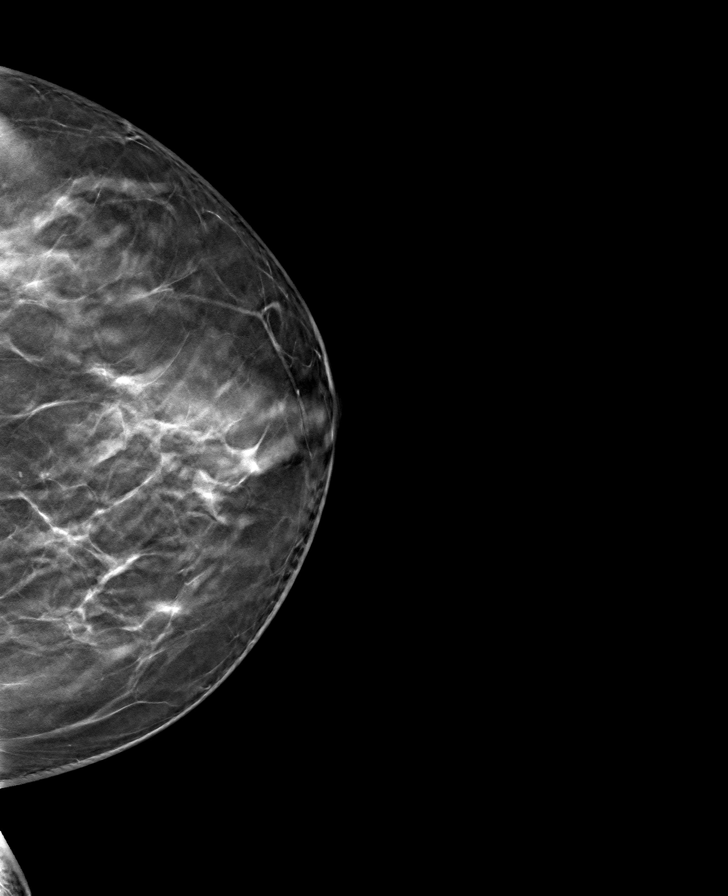

[L MLO tomo · tomo slice 45/89.0]
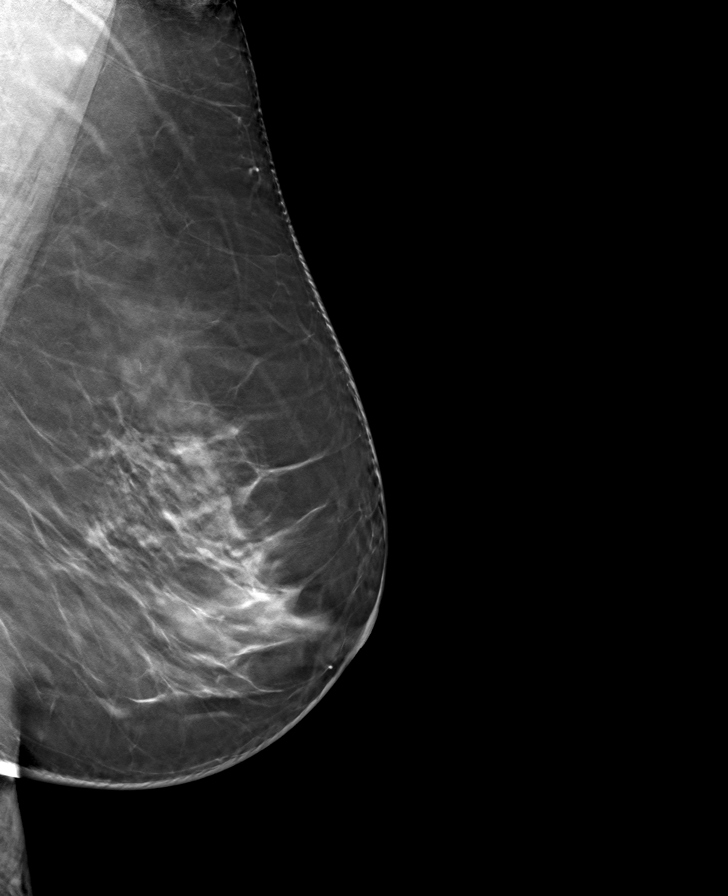

[8 of 24 positions shown; findings below may reference images not displayed]

ACR Breast Density Category c: The breast tissue is heterogeneously
dense, which may obscure small masses.
FINDINGS: There are no findings suspicious for malignancy. Images were
processed with CAD.
IMPRESSION: No mammographic evidence of malignancy. A result letter of this
screening mammogram will be mailed directly to the patient.

RECOMMENDATION:
Screening mammogram in one year. (Code:[5V])

BI-RADS CATEGORY  1: Negative.

## 2020-06-25 DIAGNOSIS — H02831 Dermatochalasis of right upper eyelid: Secondary | ICD-10-CM | POA: Diagnosis not present

## 2020-06-25 DIAGNOSIS — H02421 Myogenic ptosis of right eyelid: Secondary | ICD-10-CM | POA: Diagnosis not present

## 2020-06-25 DIAGNOSIS — E119 Type 2 diabetes mellitus without complications: Secondary | ICD-10-CM | POA: Diagnosis not present

## 2020-06-25 DIAGNOSIS — H02401 Unspecified ptosis of right eyelid: Secondary | ICD-10-CM | POA: Diagnosis not present

## 2020-06-25 DIAGNOSIS — H02834 Dermatochalasis of left upper eyelid: Secondary | ICD-10-CM | POA: Diagnosis not present

## 2020-07-09 DIAGNOSIS — I10 Essential (primary) hypertension: Secondary | ICD-10-CM | POA: Diagnosis not present

## 2020-07-09 DIAGNOSIS — E1129 Type 2 diabetes mellitus with other diabetic kidney complication: Secondary | ICD-10-CM | POA: Diagnosis not present

## 2020-07-09 DIAGNOSIS — K219 Gastro-esophageal reflux disease without esophagitis: Secondary | ICD-10-CM | POA: Diagnosis not present

## 2020-07-09 DIAGNOSIS — E782 Mixed hyperlipidemia: Secondary | ICD-10-CM | POA: Diagnosis not present

## 2020-07-10 DIAGNOSIS — Z79899 Other long term (current) drug therapy: Secondary | ICD-10-CM | POA: Diagnosis not present

## 2020-07-10 DIAGNOSIS — Z48810 Encounter for surgical aftercare following surgery on the sense organs: Secondary | ICD-10-CM | POA: Diagnosis not present

## 2020-07-10 DIAGNOSIS — Z9889 Other specified postprocedural states: Secondary | ICD-10-CM | POA: Diagnosis not present

## 2020-07-25 DIAGNOSIS — K219 Gastro-esophageal reflux disease without esophagitis: Secondary | ICD-10-CM | POA: Diagnosis not present

## 2020-07-25 DIAGNOSIS — M109 Gout, unspecified: Secondary | ICD-10-CM | POA: Diagnosis not present

## 2020-07-25 DIAGNOSIS — R609 Edema, unspecified: Secondary | ICD-10-CM | POA: Diagnosis not present

## 2020-07-25 DIAGNOSIS — I10 Essential (primary) hypertension: Secondary | ICD-10-CM | POA: Diagnosis not present

## 2020-07-25 DIAGNOSIS — M545 Low back pain, unspecified: Secondary | ICD-10-CM | POA: Diagnosis not present

## 2020-07-25 DIAGNOSIS — Z79899 Other long term (current) drug therapy: Secondary | ICD-10-CM | POA: Diagnosis not present

## 2020-07-25 DIAGNOSIS — E1129 Type 2 diabetes mellitus with other diabetic kidney complication: Secondary | ICD-10-CM | POA: Diagnosis not present

## 2020-07-31 DIAGNOSIS — E782 Mixed hyperlipidemia: Secondary | ICD-10-CM | POA: Diagnosis not present

## 2020-07-31 DIAGNOSIS — M545 Low back pain, unspecified: Secondary | ICD-10-CM | POA: Diagnosis not present

## 2020-07-31 DIAGNOSIS — I10 Essential (primary) hypertension: Secondary | ICD-10-CM | POA: Diagnosis not present

## 2020-07-31 DIAGNOSIS — Z23 Encounter for immunization: Secondary | ICD-10-CM | POA: Diagnosis not present

## 2020-07-31 DIAGNOSIS — R609 Edema, unspecified: Secondary | ICD-10-CM | POA: Diagnosis not present

## 2020-07-31 DIAGNOSIS — K219 Gastro-esophageal reflux disease without esophagitis: Secondary | ICD-10-CM | POA: Diagnosis not present

## 2020-07-31 DIAGNOSIS — R809 Proteinuria, unspecified: Secondary | ICD-10-CM | POA: Diagnosis not present

## 2020-07-31 DIAGNOSIS — N1831 Chronic kidney disease, stage 3a: Secondary | ICD-10-CM | POA: Diagnosis not present

## 2020-07-31 DIAGNOSIS — E1129 Type 2 diabetes mellitus with other diabetic kidney complication: Secondary | ICD-10-CM | POA: Diagnosis not present

## 2020-09-18 DIAGNOSIS — Z9889 Other specified postprocedural states: Secondary | ICD-10-CM | POA: Diagnosis not present

## 2020-09-18 DIAGNOSIS — Z48810 Encounter for surgical aftercare following surgery on the sense organs: Secondary | ICD-10-CM | POA: Diagnosis not present

## 2021-01-03 ENCOUNTER — Other Ambulatory Visit: Payer: Self-pay | Admitting: Family Medicine

## 2021-01-03 DIAGNOSIS — Z1231 Encounter for screening mammogram for malignant neoplasm of breast: Secondary | ICD-10-CM

## 2021-01-07 DIAGNOSIS — K219 Gastro-esophageal reflux disease without esophagitis: Secondary | ICD-10-CM | POA: Diagnosis not present

## 2021-01-07 DIAGNOSIS — I10 Essential (primary) hypertension: Secondary | ICD-10-CM | POA: Diagnosis not present

## 2021-01-07 DIAGNOSIS — N1831 Chronic kidney disease, stage 3a: Secondary | ICD-10-CM | POA: Diagnosis not present

## 2021-01-07 DIAGNOSIS — E782 Mixed hyperlipidemia: Secondary | ICD-10-CM | POA: Diagnosis not present

## 2021-01-07 DIAGNOSIS — E1129 Type 2 diabetes mellitus with other diabetic kidney complication: Secondary | ICD-10-CM | POA: Diagnosis not present

## 2021-01-24 DIAGNOSIS — E1129 Type 2 diabetes mellitus with other diabetic kidney complication: Secondary | ICD-10-CM | POA: Diagnosis not present

## 2021-01-24 DIAGNOSIS — M5451 Vertebrogenic low back pain: Secondary | ICD-10-CM | POA: Diagnosis not present

## 2021-01-24 DIAGNOSIS — Z79899 Other long term (current) drug therapy: Secondary | ICD-10-CM | POA: Diagnosis not present

## 2021-01-24 DIAGNOSIS — E782 Mixed hyperlipidemia: Secondary | ICD-10-CM | POA: Diagnosis not present

## 2021-01-24 DIAGNOSIS — Z780 Asymptomatic menopausal state: Secondary | ICD-10-CM | POA: Diagnosis not present

## 2021-01-24 DIAGNOSIS — K219 Gastro-esophageal reflux disease without esophagitis: Secondary | ICD-10-CM | POA: Diagnosis not present

## 2021-01-24 DIAGNOSIS — Z7984 Long term (current) use of oral hypoglycemic drugs: Secondary | ICD-10-CM | POA: Diagnosis not present

## 2021-01-24 DIAGNOSIS — E1165 Type 2 diabetes mellitus with hyperglycemia: Secondary | ICD-10-CM | POA: Diagnosis not present

## 2021-01-24 DIAGNOSIS — I10 Essential (primary) hypertension: Secondary | ICD-10-CM | POA: Diagnosis not present

## 2021-01-24 DIAGNOSIS — M109 Gout, unspecified: Secondary | ICD-10-CM | POA: Diagnosis not present

## 2021-01-24 DIAGNOSIS — R809 Proteinuria, unspecified: Secondary | ICD-10-CM | POA: Diagnosis not present

## 2021-01-28 DIAGNOSIS — M109 Gout, unspecified: Secondary | ICD-10-CM | POA: Diagnosis not present

## 2021-01-28 DIAGNOSIS — R609 Edema, unspecified: Secondary | ICD-10-CM | POA: Diagnosis not present

## 2021-01-28 DIAGNOSIS — K219 Gastro-esophageal reflux disease without esophagitis: Secondary | ICD-10-CM | POA: Diagnosis not present

## 2021-01-28 DIAGNOSIS — E782 Mixed hyperlipidemia: Secondary | ICD-10-CM | POA: Diagnosis not present

## 2021-01-28 DIAGNOSIS — R809 Proteinuria, unspecified: Secondary | ICD-10-CM | POA: Diagnosis not present

## 2021-01-28 DIAGNOSIS — Z0000 Encounter for general adult medical examination without abnormal findings: Secondary | ICD-10-CM | POA: Diagnosis not present

## 2021-01-28 DIAGNOSIS — I10 Essential (primary) hypertension: Secondary | ICD-10-CM | POA: Diagnosis not present

## 2021-01-28 DIAGNOSIS — Z7984 Long term (current) use of oral hypoglycemic drugs: Secondary | ICD-10-CM | POA: Diagnosis not present

## 2021-01-28 DIAGNOSIS — I6523 Occlusion and stenosis of bilateral carotid arteries: Secondary | ICD-10-CM | POA: Diagnosis not present

## 2021-01-28 DIAGNOSIS — E1129 Type 2 diabetes mellitus with other diabetic kidney complication: Secondary | ICD-10-CM | POA: Diagnosis not present

## 2021-01-29 ENCOUNTER — Other Ambulatory Visit: Payer: Self-pay | Admitting: Family Medicine

## 2021-01-29 DIAGNOSIS — E2839 Other primary ovarian failure: Secondary | ICD-10-CM

## 2021-02-07 DIAGNOSIS — Z1211 Encounter for screening for malignant neoplasm of colon: Secondary | ICD-10-CM | POA: Diagnosis not present

## 2021-02-12 ENCOUNTER — Ambulatory Visit
Admission: RE | Admit: 2021-02-12 | Discharge: 2021-02-12 | Disposition: A | Discharge status: Home / Self Care | Payer: Medicare Other | Source: Ambulatory Visit | Attending: Family Medicine | Admitting: Family Medicine

## 2021-02-12 ENCOUNTER — Other Ambulatory Visit: Payer: Self-pay

## 2021-02-12 DIAGNOSIS — E2839 Other primary ovarian failure: Secondary | ICD-10-CM

## 2021-02-12 DIAGNOSIS — Z780 Asymptomatic menopausal state: Secondary | ICD-10-CM | POA: Diagnosis not present

## 2021-02-15 ENCOUNTER — Ambulatory Visit: Payer: Medicare Other | Admitting: Physical Therapy

## 2021-02-19 ENCOUNTER — Other Ambulatory Visit: Payer: Self-pay

## 2021-02-19 ENCOUNTER — Ambulatory Visit
Admission: RE | Admit: 2021-02-19 | Discharge: 2021-02-19 | Disposition: A | Discharge status: Home / Self Care | Payer: Medicare Other | Source: Ambulatory Visit

## 2021-02-19 DIAGNOSIS — Z1231 Encounter for screening mammogram for malignant neoplasm of breast: Secondary | ICD-10-CM | POA: Diagnosis not present

## 2021-02-19 IMAGING — MG MM DIGITAL SCREENING BILAT W/ TOMO AND CAD
8 series · 8 of 24 positions shown · non-contrast
Comparison: Previous exam(s).

CLINICAL DATA: Screening.

EXAM:
DIGITAL SCREENING BILATERAL MAMMOGRAM WITH TOMOSYNTHESIS AND CAD
TECHNIQUE: Bilateral screening digital craniocaudal and mediolateral oblique
mammograms were obtained. Bilateral screening digital breast
tomosynthesis was performed. The images were evaluated with
computer-aided detection.

[R CC synth-2D]
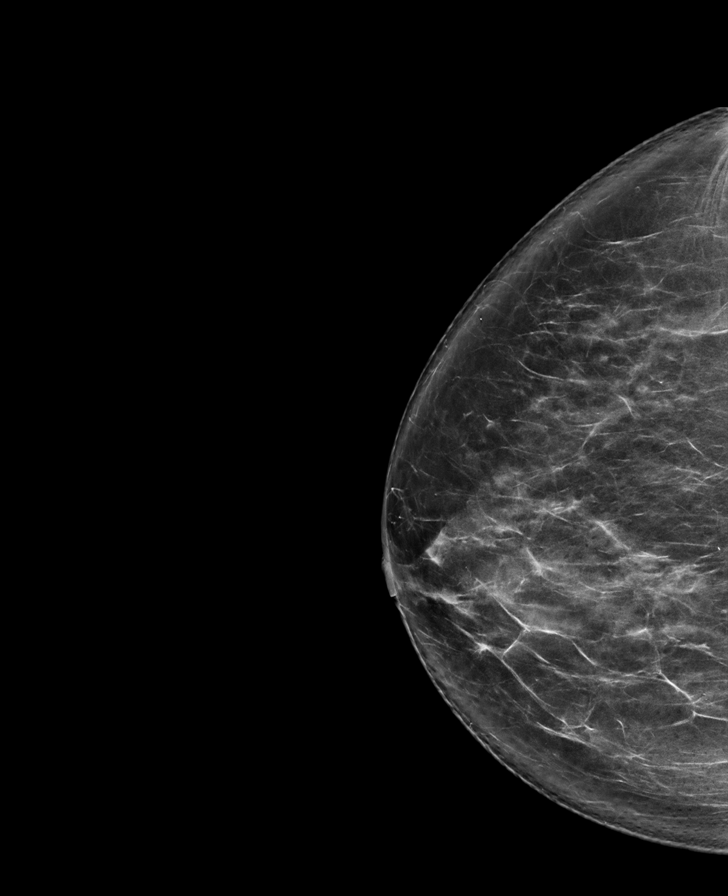

[L CC synth-2D]
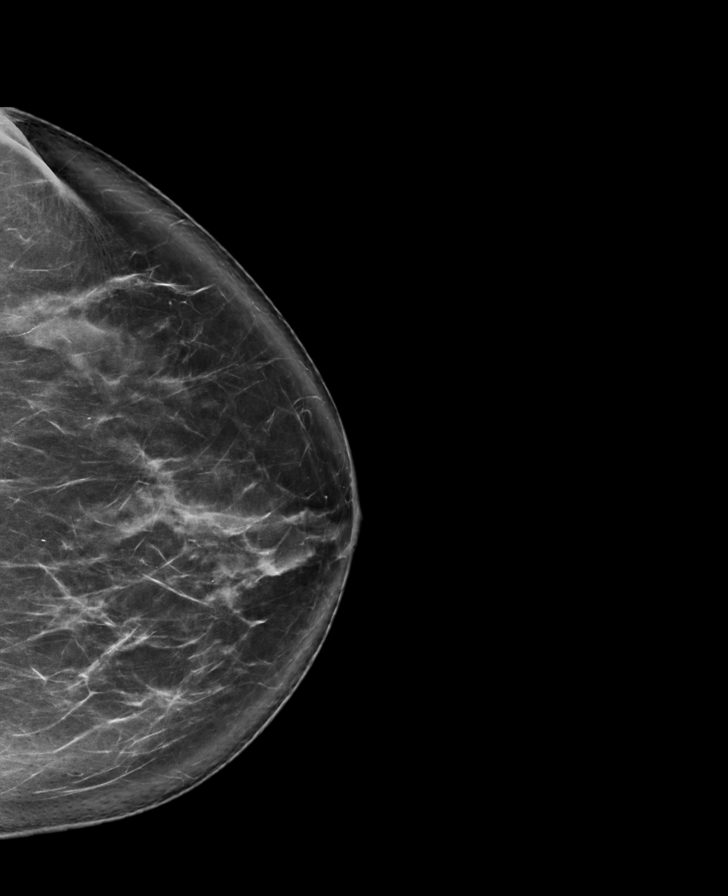

[R MLO synth-2D]
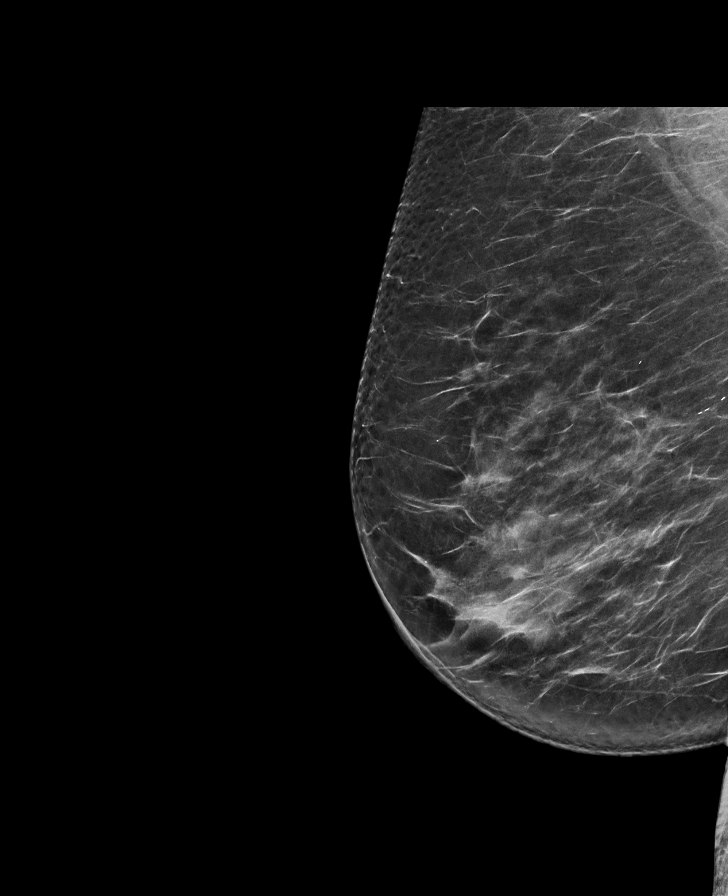

[L MLO synth-2D]
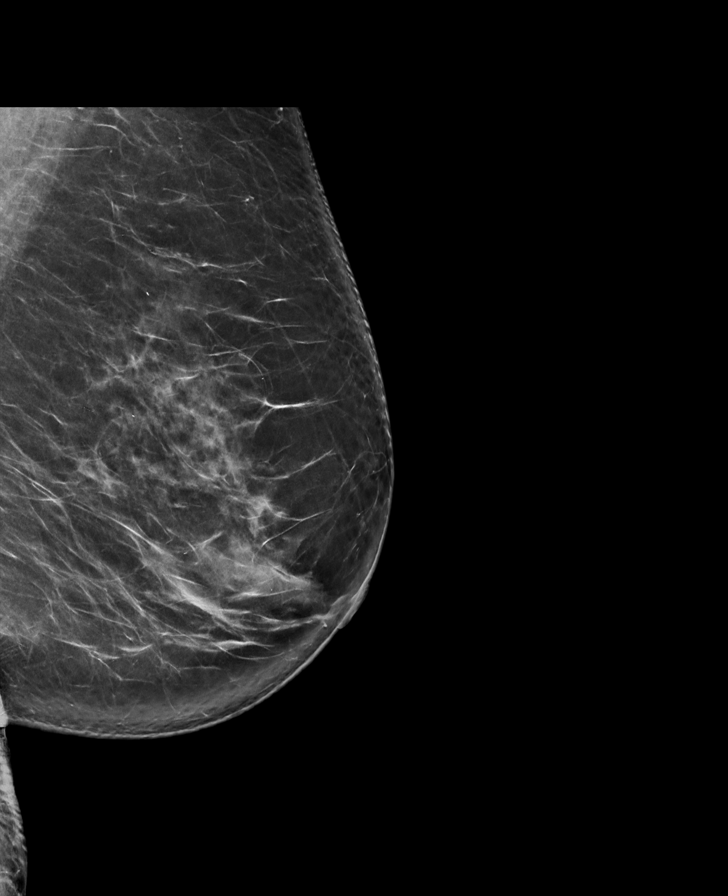

[L CC tomo · tomo slice 47/93.0]
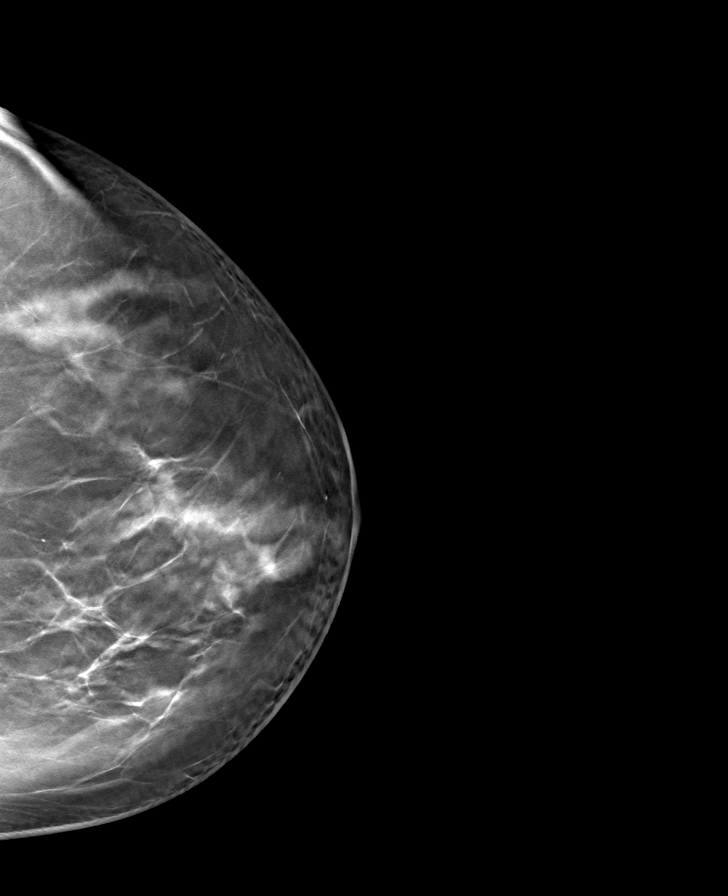

[R CC tomo · tomo slice 43/85.0]
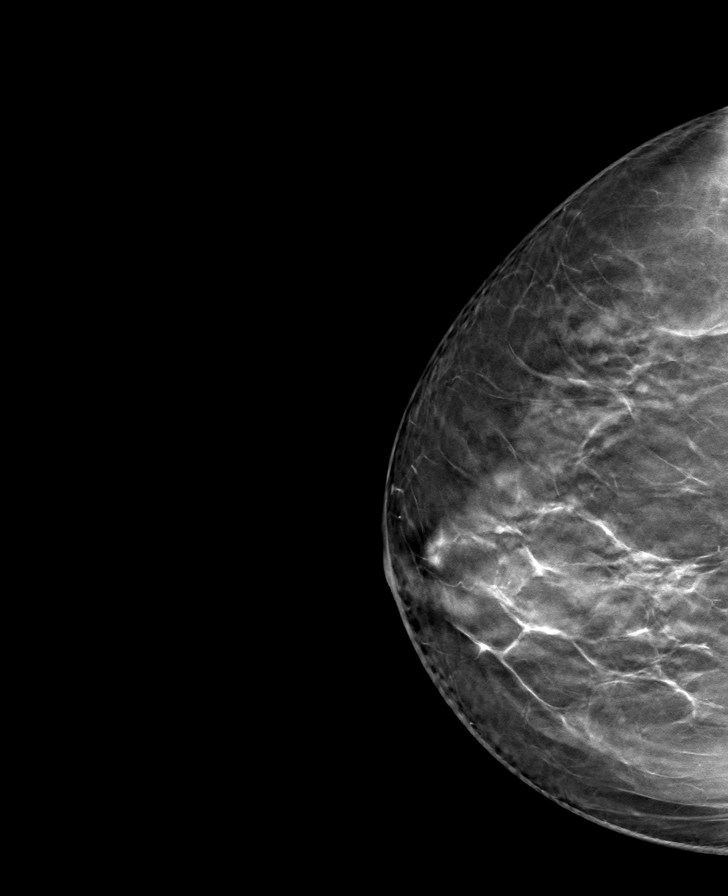

[R MLO tomo · tomo slice 45/89.0]
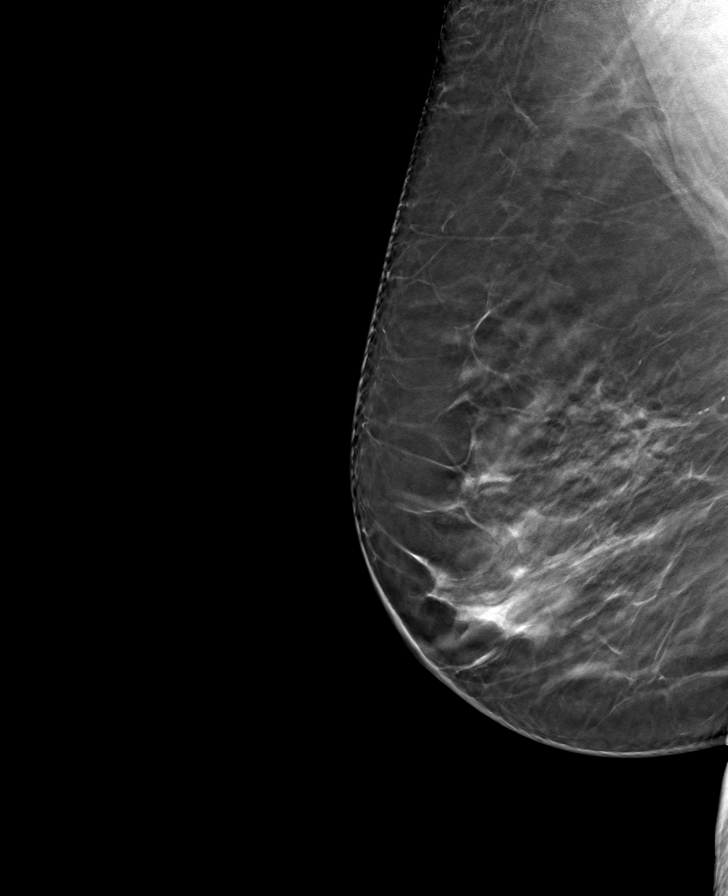

[L MLO tomo · tomo slice 47/93.0]
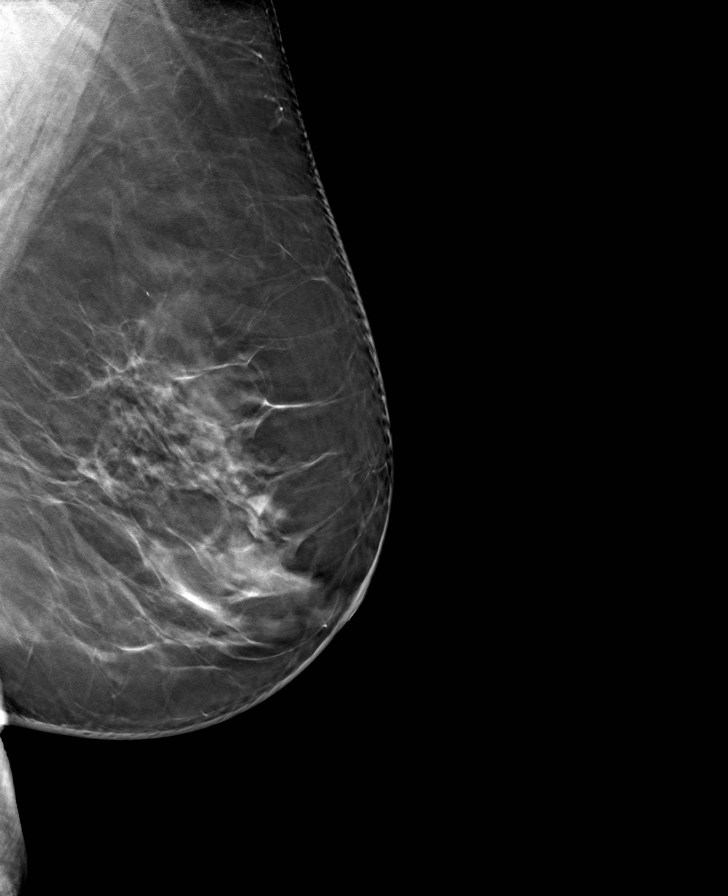

[8 of 24 positions shown; findings below may reference images not displayed]

ACR Breast Density Category c: The breast tissue is heterogeneously
dense, which may obscure small masses.
FINDINGS: There are no findings suspicious for malignancy.
IMPRESSION: No mammographic evidence of malignancy. A result letter of this
screening mammogram will be mailed directly to the patient.

RECOMMENDATION:
Screening mammogram in one year. (Code:[V2])

BI-RADS CATEGORY  1: Negative.

## 2021-02-27 DIAGNOSIS — H35372 Puckering of macula, left eye: Secondary | ICD-10-CM | POA: Diagnosis not present

## 2021-02-27 DIAGNOSIS — Z961 Presence of intraocular lens: Secondary | ICD-10-CM | POA: Diagnosis not present

## 2021-02-27 DIAGNOSIS — H02831 Dermatochalasis of right upper eyelid: Secondary | ICD-10-CM | POA: Diagnosis not present

## 2021-02-27 DIAGNOSIS — H16143 Punctate keratitis, bilateral: Secondary | ICD-10-CM | POA: Diagnosis not present

## 2021-02-27 DIAGNOSIS — H02834 Dermatochalasis of left upper eyelid: Secondary | ICD-10-CM | POA: Diagnosis not present

## 2021-04-30 DIAGNOSIS — I10 Essential (primary) hypertension: Secondary | ICD-10-CM | POA: Diagnosis not present

## 2021-04-30 DIAGNOSIS — E1129 Type 2 diabetes mellitus with other diabetic kidney complication: Secondary | ICD-10-CM | POA: Diagnosis not present

## 2021-04-30 DIAGNOSIS — E782 Mixed hyperlipidemia: Secondary | ICD-10-CM | POA: Diagnosis not present

## 2021-04-30 DIAGNOSIS — K219 Gastro-esophageal reflux disease without esophagitis: Secondary | ICD-10-CM | POA: Diagnosis not present

## 2021-04-30 DIAGNOSIS — N1831 Chronic kidney disease, stage 3a: Secondary | ICD-10-CM | POA: Diagnosis not present

## 2021-05-29 ENCOUNTER — Other Ambulatory Visit: Payer: Self-pay | Admitting: Dentistry

## 2021-05-29 DIAGNOSIS — G500 Trigeminal neuralgia: Secondary | ICD-10-CM

## 2021-06-05 ENCOUNTER — Ambulatory Visit
Admission: RE | Admit: 2021-06-05 | Discharge: 2021-06-05 | Disposition: A | Discharge status: Home / Self Care | Payer: Medicare Other | Source: Ambulatory Visit | Attending: Dentistry | Admitting: Dentistry

## 2021-06-05 ENCOUNTER — Other Ambulatory Visit: Payer: Medicare Other

## 2021-06-05 DIAGNOSIS — G500 Trigeminal neuralgia: Secondary | ICD-10-CM

## 2021-06-05 IMAGING — MR MR MRA HEAD W/O CM
1 series · 11 of 48 positions shown · non-contrast
Comparison: None available.

CLINICAL DATA: Initial evaluation for trigeminal neuralgia, left
facial pain for 2 years.

EXAM:
MRA HEAD WITHOUT CONTRAST
TECHNIQUE: Angiographic images of the Circle of Willis were acquired using MRA
technique without intravenous contrast.

[Series 5: [id number]_multi-slab · axial · 0.6mm · 0.26mm/px · z∈[-28,+44]mm · 11 of 149 slices shown]
[im 10/149]
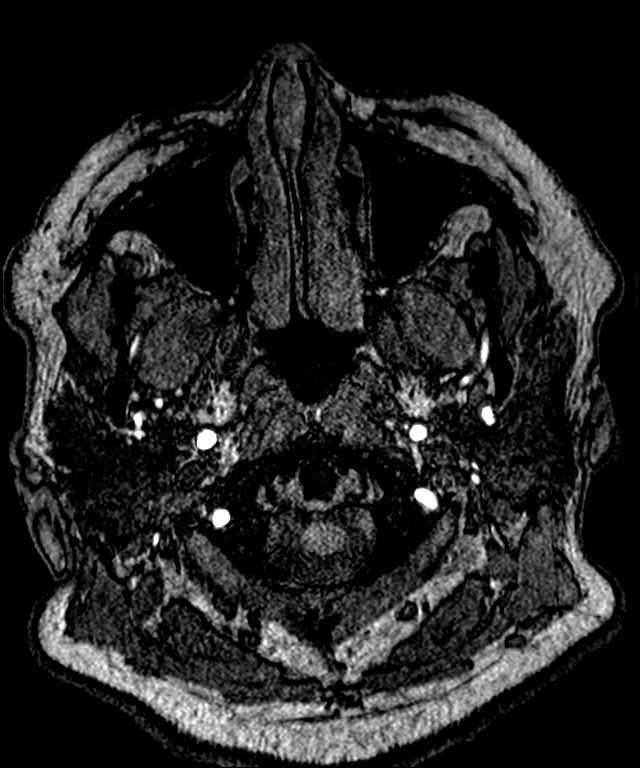
[im 26/149]
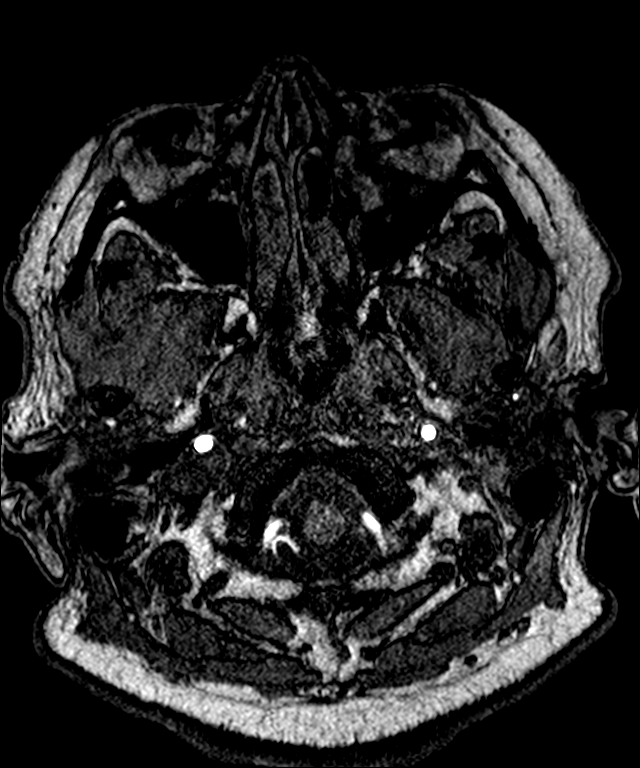
[im 29/149]
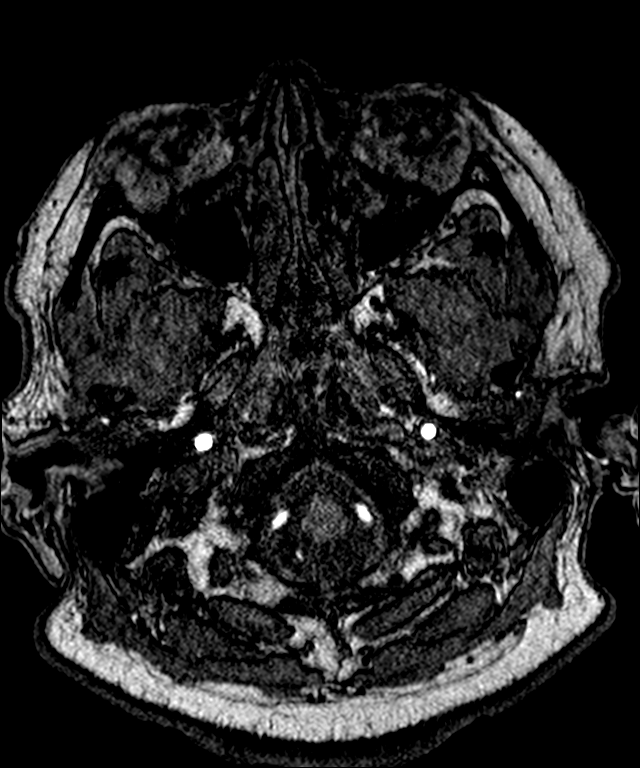
[im 48/149]
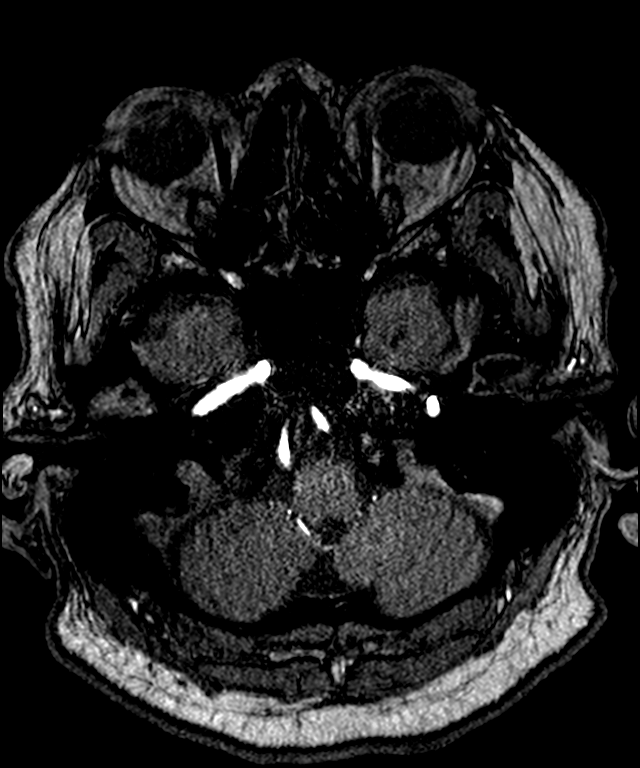
[im 67/149]
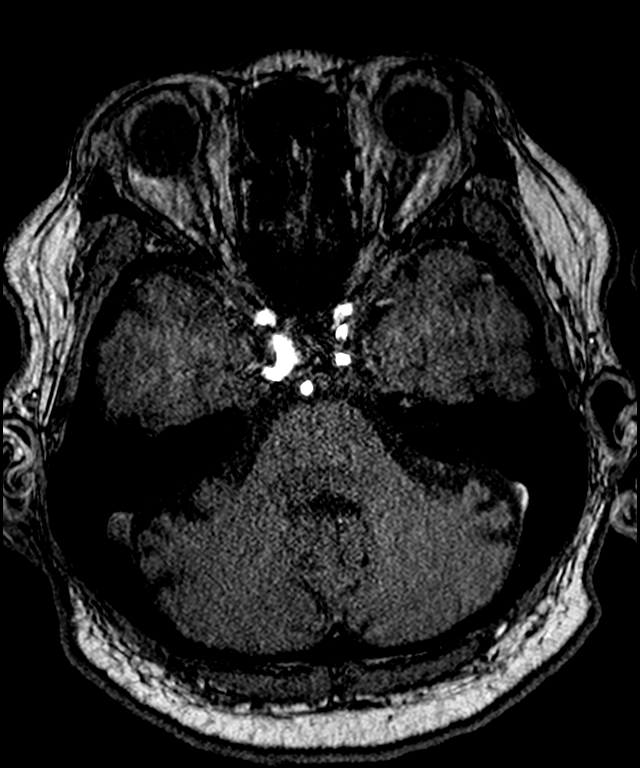
[im 76/149]
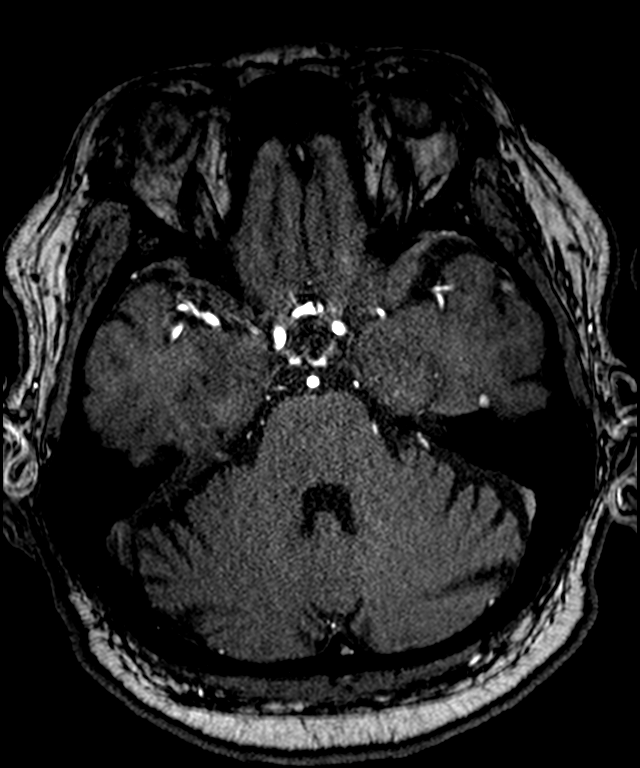
[im 86/149]
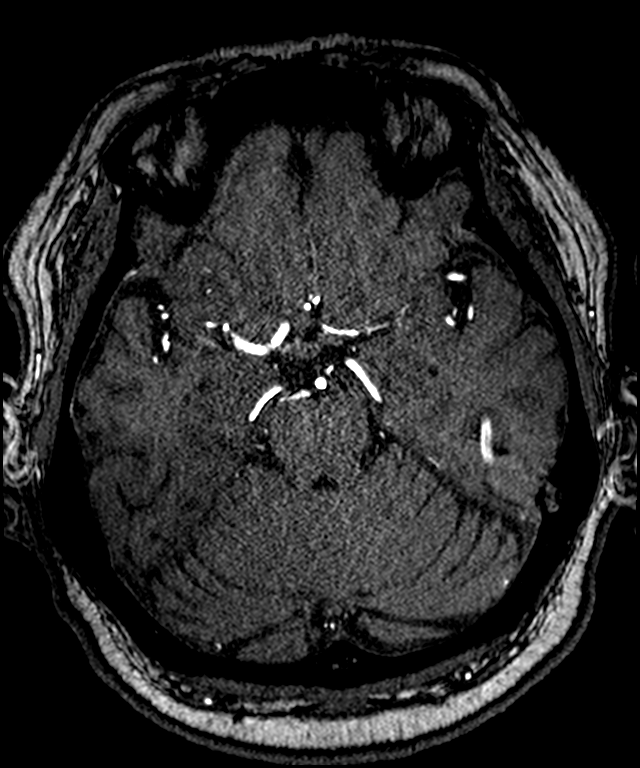
[im 104/149]
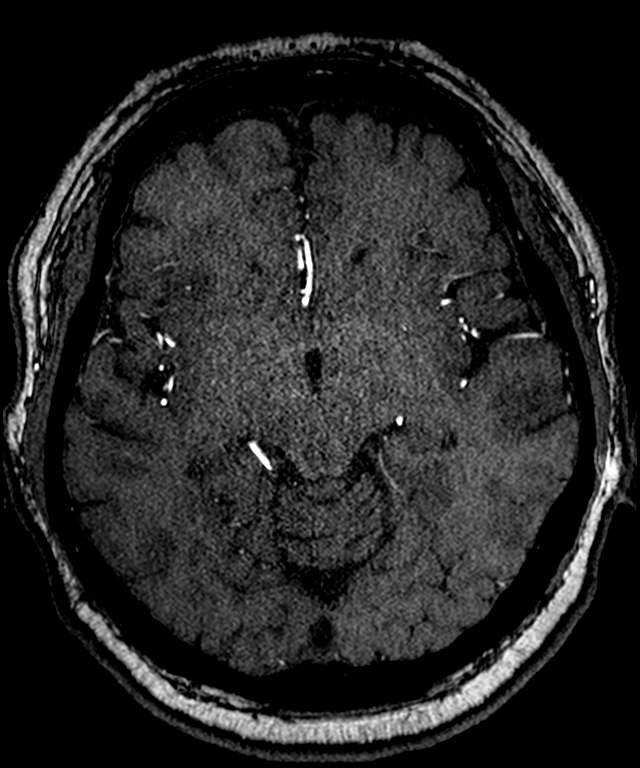
[im 123/149]
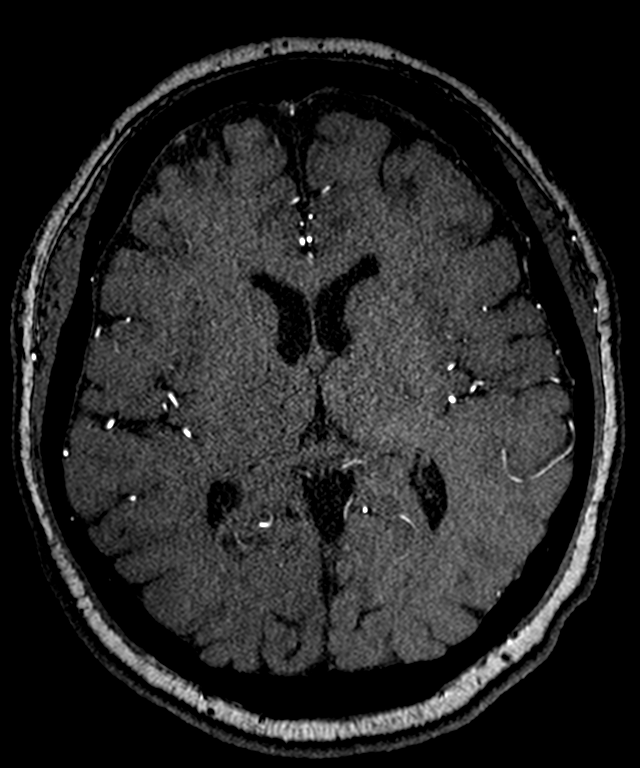
[im 126/149]
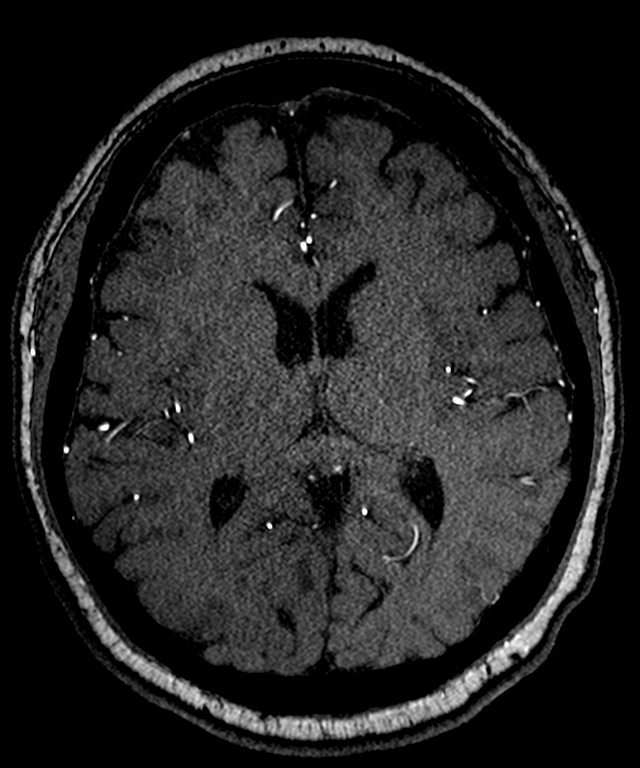
[im 142/149]
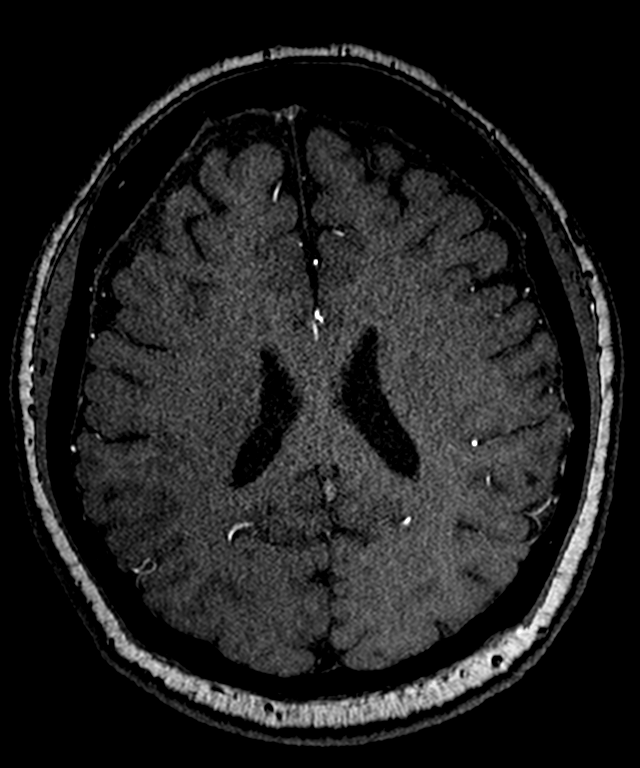

[11 of 48 positions shown; findings below may reference images not displayed]

FINDINGS: Anterior circulation: Visualized distal cervical segments of the
internal carotid arteries are patent with antegrade flow. Petrous,
cavernous, and supraclinoid segments patent without stenosis or
other abnormality. A1 segments widely patent. Normal anterior
communicating artery complex. Anterior cerebral arteries patent
without stenosis. No M1 stenosis or occlusion. Normal MCA
bifurcations. Distal MCA branches perfused and symmetric.

Posterior circulation: Vertebral arteries are largely code dominant
and widely patent to the vertebrobasilar junction. Both PICA origins
patent and normal. Basilar widely patent to its distal aspect
without stenosis. Dominant left AICA noted. Superior cerebellar
arteries patent bilaterally. Note is made of the left SCA coursing
in close proximity to the nerve root entry zone of the left
trigeminal nerve (series 5, image 72). No obvious vascular
impingement on this MRA only exam. Both PCAs primarily supplied via
the basilar and are well perfused or distal aspects without
stenosis.

Anatomic variants: None significant.

Other: No intracranial aneurysm or other vascular malformation.
IMPRESSION: 1. The left SCA courses in close proximity to the nerve root entry
zone of the left trigeminal nerve. Correlation with dedicated
trigeminal nerve protocol MRI of the brain, with and without
contrast, to evaluate for a possible vascular loop syndrome
suggested.
2. Otherwise unremarkable and normal intracranial MRA. No large
vessel occlusion or hemodynamically significant stenosis. No
aneurysm.

## 2021-06-26 DIAGNOSIS — G500 Trigeminal neuralgia: Secondary | ICD-10-CM | POA: Diagnosis not present

## 2021-06-28 DIAGNOSIS — N1831 Chronic kidney disease, stage 3a: Secondary | ICD-10-CM | POA: Diagnosis not present

## 2021-06-28 DIAGNOSIS — I10 Essential (primary) hypertension: Secondary | ICD-10-CM | POA: Diagnosis not present

## 2021-06-28 DIAGNOSIS — E782 Mixed hyperlipidemia: Secondary | ICD-10-CM | POA: Diagnosis not present

## 2021-06-28 DIAGNOSIS — K219 Gastro-esophageal reflux disease without esophagitis: Secondary | ICD-10-CM | POA: Diagnosis not present

## 2021-06-28 DIAGNOSIS — E1129 Type 2 diabetes mellitus with other diabetic kidney complication: Secondary | ICD-10-CM | POA: Diagnosis not present

## 2021-07-09 DIAGNOSIS — I10 Essential (primary) hypertension: Secondary | ICD-10-CM | POA: Diagnosis not present

## 2021-07-09 DIAGNOSIS — E782 Mixed hyperlipidemia: Secondary | ICD-10-CM | POA: Diagnosis not present

## 2021-07-09 DIAGNOSIS — E1129 Type 2 diabetes mellitus with other diabetic kidney complication: Secondary | ICD-10-CM | POA: Diagnosis not present

## 2021-07-11 DIAGNOSIS — G500 Trigeminal neuralgia: Secondary | ICD-10-CM | POA: Diagnosis not present

## 2021-07-22 ENCOUNTER — Telehealth: Payer: Self-pay | Admitting: Emergency Medicine

## 2021-07-22 ENCOUNTER — Inpatient Hospital Stay (HOSPITAL_COMMUNITY): Payer: Medicare Other

## 2021-07-22 ENCOUNTER — Encounter (HOSPITAL_COMMUNITY): Payer: Self-pay

## 2021-07-22 ENCOUNTER — Inpatient Hospital Stay (HOSPITAL_COMMUNITY)
Admission: EM | Admit: 2021-07-22 | Discharge: 2021-07-25 | DRG: 644 | Disposition: A | Discharge status: Home / Self Care | Payer: Medicare Other | Attending: Internal Medicine | Admitting: Internal Medicine

## 2021-07-22 ENCOUNTER — Other Ambulatory Visit: Payer: Self-pay

## 2021-07-22 ENCOUNTER — Emergency Department (HOSPITAL_COMMUNITY): Payer: Medicare Other

## 2021-07-22 DIAGNOSIS — R1011 Right upper quadrant pain: Secondary | ICD-10-CM

## 2021-07-22 DIAGNOSIS — Z79899 Other long term (current) drug therapy: Secondary | ICD-10-CM

## 2021-07-22 DIAGNOSIS — R030 Elevated blood-pressure reading, without diagnosis of hypertension: Secondary | ICD-10-CM | POA: Diagnosis not present

## 2021-07-22 DIAGNOSIS — Z96652 Presence of left artificial knee joint: Secondary | ICD-10-CM | POA: Diagnosis present

## 2021-07-22 DIAGNOSIS — K862 Cyst of pancreas: Secondary | ICD-10-CM | POA: Diagnosis not present

## 2021-07-22 DIAGNOSIS — Z6833 Body mass index (BMI) 33.0-33.9, adult: Secondary | ICD-10-CM | POA: Diagnosis not present

## 2021-07-22 DIAGNOSIS — I700 Atherosclerosis of aorta: Secondary | ICD-10-CM | POA: Diagnosis not present

## 2021-07-22 DIAGNOSIS — E669 Obesity, unspecified: Secondary | ICD-10-CM | POA: Diagnosis present

## 2021-07-22 DIAGNOSIS — E871 Hypo-osmolality and hyponatremia: Secondary | ICD-10-CM | POA: Diagnosis not present

## 2021-07-22 DIAGNOSIS — E222 Syndrome of inappropriate secretion of antidiuretic hormone: Secondary | ICD-10-CM | POA: Diagnosis not present

## 2021-07-22 DIAGNOSIS — R935 Abnormal findings on diagnostic imaging of other abdominal regions, including retroperitoneum: Secondary | ICD-10-CM | POA: Diagnosis not present

## 2021-07-22 DIAGNOSIS — K8020 Calculus of gallbladder without cholecystitis without obstruction: Secondary | ICD-10-CM | POA: Diagnosis not present

## 2021-07-22 DIAGNOSIS — K219 Gastro-esophageal reflux disease without esophagitis: Secondary | ICD-10-CM | POA: Diagnosis present

## 2021-07-22 DIAGNOSIS — I10 Essential (primary) hypertension: Secondary | ICD-10-CM | POA: Diagnosis not present

## 2021-07-22 DIAGNOSIS — K869 Disease of pancreas, unspecified: Secondary | ICD-10-CM | POA: Diagnosis not present

## 2021-07-22 DIAGNOSIS — Z20822 Contact with and (suspected) exposure to covid-19: Secondary | ICD-10-CM | POA: Diagnosis not present

## 2021-07-22 DIAGNOSIS — Z7984 Long term (current) use of oral hypoglycemic drugs: Secondary | ICD-10-CM | POA: Diagnosis not present

## 2021-07-22 DIAGNOSIS — R1013 Epigastric pain: Secondary | ICD-10-CM

## 2021-07-22 DIAGNOSIS — K851 Biliary acute pancreatitis without necrosis or infection: Secondary | ICD-10-CM | POA: Diagnosis not present

## 2021-07-22 DIAGNOSIS — T421X5A Adverse effect of iminostilbenes, initial encounter: Secondary | ICD-10-CM | POA: Diagnosis not present

## 2021-07-22 DIAGNOSIS — E119 Type 2 diabetes mellitus without complications: Secondary | ICD-10-CM | POA: Diagnosis not present

## 2021-07-22 DIAGNOSIS — G500 Trigeminal neuralgia: Secondary | ICD-10-CM | POA: Diagnosis not present

## 2021-07-22 DIAGNOSIS — K760 Fatty (change of) liver, not elsewhere classified: Secondary | ICD-10-CM | POA: Diagnosis not present

## 2021-07-22 DIAGNOSIS — K7689 Other specified diseases of liver: Secondary | ICD-10-CM | POA: Diagnosis not present

## 2021-07-22 DIAGNOSIS — R42 Dizziness and giddiness: Secondary | ICD-10-CM | POA: Diagnosis not present

## 2021-07-22 DIAGNOSIS — R55 Syncope and collapse: Secondary | ICD-10-CM

## 2021-07-22 DIAGNOSIS — E785 Hyperlipidemia, unspecified: Secondary | ICD-10-CM | POA: Diagnosis not present

## 2021-07-22 DIAGNOSIS — K8080 Other cholelithiasis without obstruction: Secondary | ICD-10-CM

## 2021-07-22 LAB — CBC
HCT: 39.9 % (ref 36.0–46.0)
Hemoglobin: 13.4 g/dL (ref 12.0–15.0)
MCH: 24.8 pg — ABNORMAL LOW (ref 26.0–34.0)
MCHC: 33.6 g/dL (ref 30.0–36.0)
MCV: 73.9 fL — ABNORMAL LOW (ref 80.0–100.0)
Platelets: 231 10*3/uL (ref 150–400)
RBC: 5.4 MIL/uL — ABNORMAL HIGH (ref 3.87–5.11)
RDW: 13.7 % (ref 11.5–15.5)
WBC: 5.5 10*3/uL (ref 4.0–10.5)
nRBC: 0 % (ref 0.0–0.2)

## 2021-07-22 LAB — RESP PANEL BY RT-PCR (FLU A&B, COVID) ARPGX2
Influenza A by PCR: NEGATIVE
Influenza B by PCR: NEGATIVE
SARS Coronavirus 2 by RT PCR: NEGATIVE

## 2021-07-22 LAB — CREATININE, URINE, RANDOM: Creatinine, Urine: 179.44 mg/dL

## 2021-07-22 LAB — URINALYSIS, ROUTINE W REFLEX MICROSCOPIC
Bacteria, UA: NONE SEEN
Bilirubin Urine: NEGATIVE
Glucose, UA: NEGATIVE mg/dL
Hgb urine dipstick: NEGATIVE
Ketones, ur: NEGATIVE mg/dL
Nitrite: NEGATIVE
Protein, ur: 100 mg/dL — AB
Specific Gravity, Urine: 1.016 (ref 1.005–1.030)
pH: 5 (ref 5.0–8.0)

## 2021-07-22 LAB — COMPREHENSIVE METABOLIC PANEL
ALT: 53 U/L — ABNORMAL HIGH (ref 0–44)
AST: 53 U/L — ABNORMAL HIGH (ref 15–41)
Albumin: 4.8 g/dL (ref 3.5–5.0)
Alkaline Phosphatase: 92 U/L (ref 38–126)
Anion gap: 11 (ref 5–15)
BUN: 17 mg/dL (ref 8–23)
CO2: 22 mmol/L (ref 22–32)
Calcium: 9.6 mg/dL (ref 8.9–10.3)
Chloride: 90 mmol/L — ABNORMAL LOW (ref 98–111)
Creatinine, Ser: 1.08 mg/dL — ABNORMAL HIGH (ref 0.44–1.00)
GFR, Estimated: 54 mL/min — ABNORMAL LOW (ref >60)
Glucose, Bld: 105 mg/dL — ABNORMAL HIGH (ref 70–99)
Potassium: 4.4 mmol/L (ref 3.5–5.1)
Sodium: 123 mmol/L — ABNORMAL LOW (ref 135–145)
Total Bilirubin: 0.5 mg/dL (ref 0.3–1.2)
Total Protein: 8.2 g/dL — ABNORMAL HIGH (ref 6.5–8.1)

## 2021-07-22 LAB — DIFFERENTIAL
Abs Immature Granulocytes: 0.03 10*3/uL (ref 0.00–0.07)
Basophils Absolute: 0.1 10*3/uL (ref 0.0–0.1)
Basophils Relative: 1 %
Eosinophils Absolute: 0 10*3/uL (ref 0.0–0.5)
Eosinophils Relative: 0 %
Immature Granulocytes: 1 %
Lymphocytes Relative: 28 %
Lymphs Abs: 1.5 10*3/uL (ref 0.7–4.0)
Monocytes Absolute: 0.5 10*3/uL (ref 0.1–1.0)
Monocytes Relative: 8 %
Neutro Abs: 3.4 10*3/uL (ref 1.7–7.7)
Neutrophils Relative %: 62 %

## 2021-07-22 LAB — NA AND K (SODIUM & POTASSIUM), RAND UR
Potassium Urine: 46 mmol/L
Sodium, Ur: 37 mmol/L

## 2021-07-22 LAB — OSMOLALITY: Osmolality: 263 mOsm/kg — ABNORMAL LOW (ref 275–295)

## 2021-07-22 LAB — LIPASE, BLOOD: Lipase: 74 U/L — ABNORMAL HIGH (ref 11–51)

## 2021-07-22 LAB — OSMOLALITY, URINE: Osmolality, Ur: 581 mOsm/kg (ref 300–900)

## 2021-07-22 LAB — TROPONIN I (HIGH SENSITIVITY): Troponin I (High Sensitivity): 3 ng/L (ref <18)

## 2021-07-22 IMAGING — MR MR ABDOMEN WO/W CM MRCP
18 of 21 series · 40 of 48 positions shown · IV contrast (gadavist)
Comparison: No prior abdominal MRI. CT the abdomen and pelvis
[DATE]. Abdominal ultrasound [DATE].

CLINICAL DATA: 75-year-old female with history of pancreatic cyst.
Follow-up study.

EXAM:
MRI ABDOMEN WITHOUT AND WITH CONTRAST (INCLUDING MRCP)
TECHNIQUE: Multiplanar multisequence MR imaging of the abdomen was performed
both before and after the administration of intravenous contrast.
Heavily T2-weighted images of the biliary and pancreatic ducts were
obtained, and three-dimensional MRCP images were rendered by post
processing.
CONTRAST:  9mL GADAVIST GADOBUTROL 1 MMOL/ML IV SOLN

[Series 3: T2 fat-sat · axial · 6.0mm · 1.25mm/px · 1 of 36 slices shown]
[im 1/36]
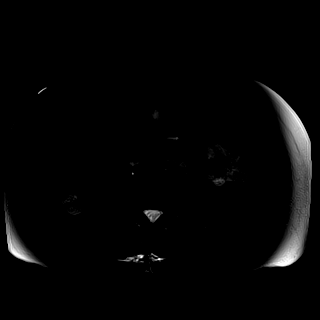

[Series 6: T2 · coronal · 6.0mm · 1.48mm/px · 1 of 30 slices shown (1 of 2)]
[im 1/30]
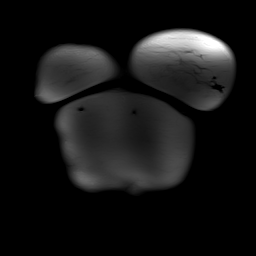

[Series 7: DWI · axial · 6.0mm · 1.49mm/px · 1 of 72 slices shown (1 of 2)]
[im 1/72]
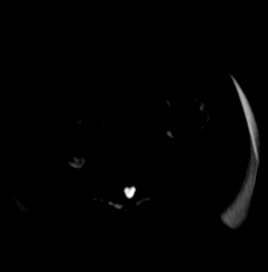

[Series 8: DWI · axial · 6.0mm · 1.49mm/px · 1 of 36 slices shown (2 of 2)]
[im 1/36]
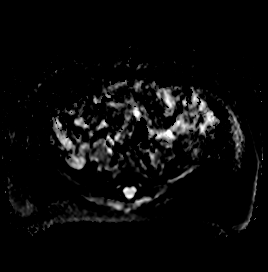

[Series 9: T1 · axial · 3.3mm · 1.25mm/px · z∈[-33,+228]mm · 3 of 80 slices shown (1 of 2)]
[im 1/80]
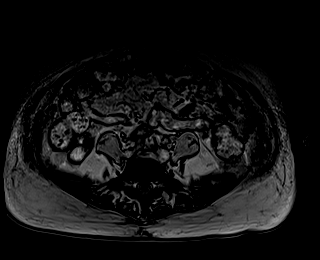
[im 40/80]
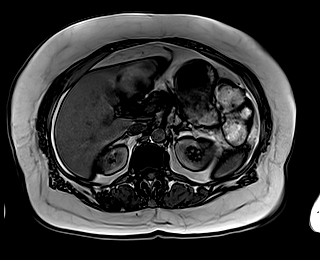
[im 80/80]
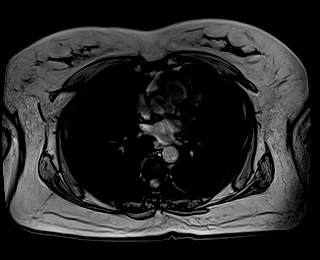

[Series 10: T1 · axial · 3.3mm · 1.25mm/px · z∈[-33,+228]mm · 3 of 80 slices shown (2 of 2)]
[im 1/80]
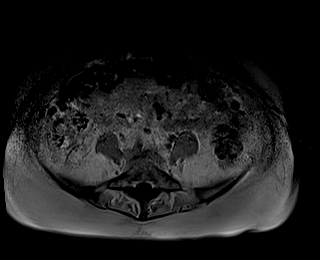
[im 40/80]
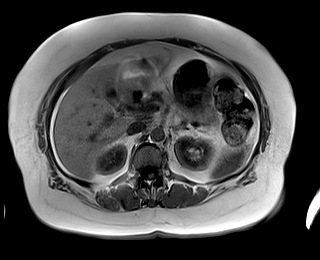
[im 80/80]
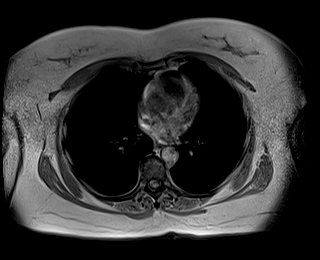

[Series 11: cor obl thk · sagittal · 50.0mm · 0.78mm/px · 1 of 9 slices shown]
[im 1/9]
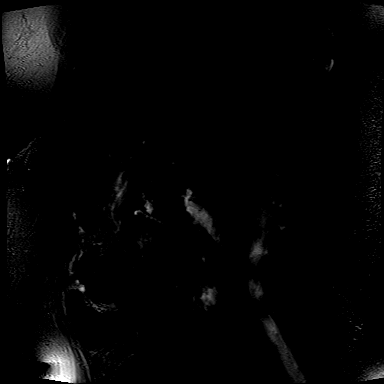

[Series 13: cor_3d_spc_trig · coronal · 1.0mm · 0.49mm/px · 2 of 72 slices shown]
[im 1/72]
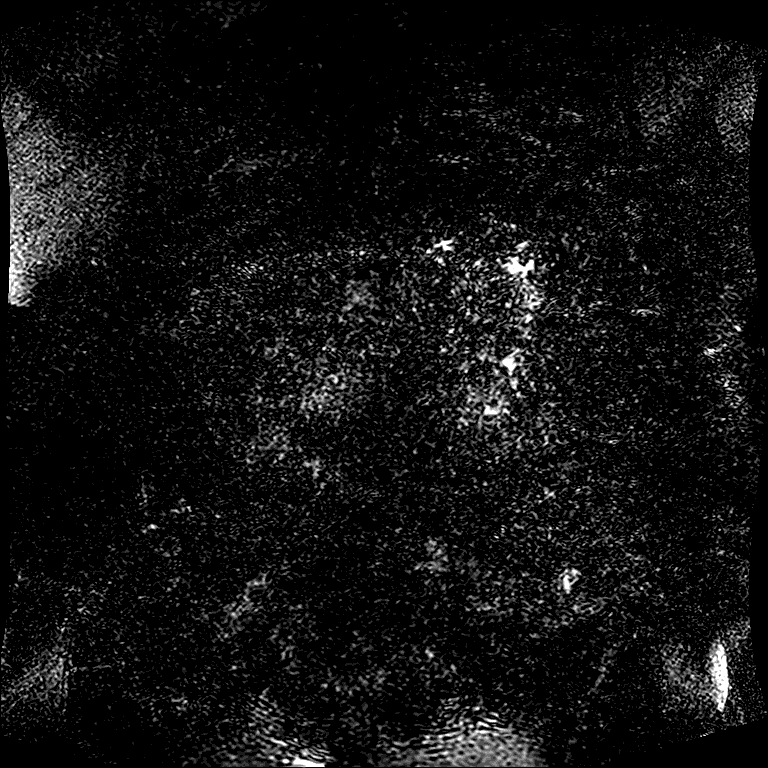
[im 72/72]
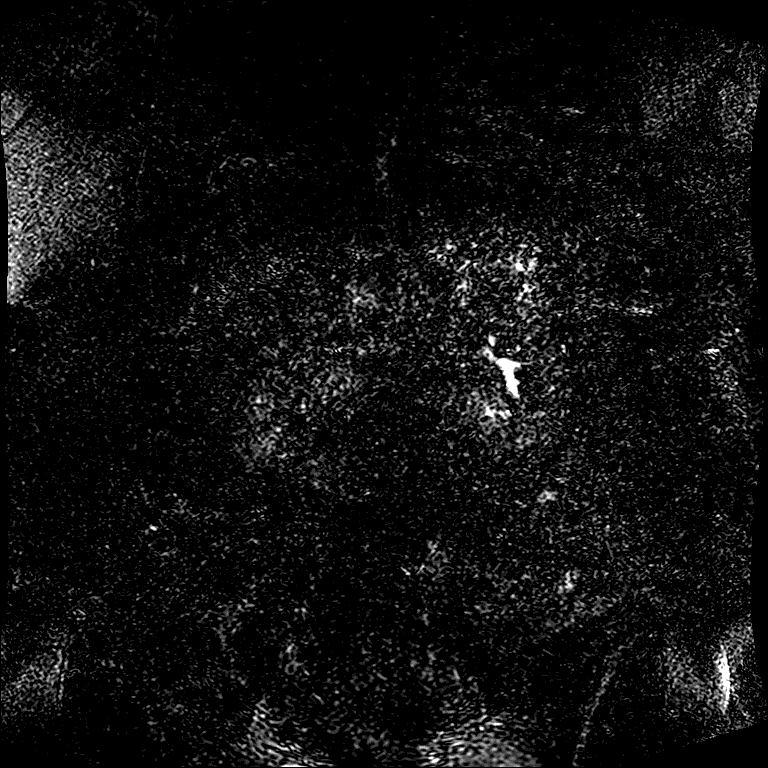

[Series 15: T2 · axial · 6.0mm · 1.56mm/px · 1 of 30 slices shown (2 of 2)]
[im 1/30]
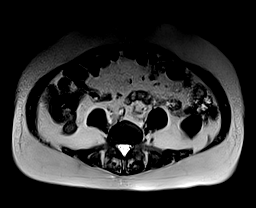

[Series 17: T1 dynamic · axial · 3.0mm · 1.25mm/px · z∈[-41,+220]mm · 3 of 88 slices shown (1 of 9)]
[im 1/88]
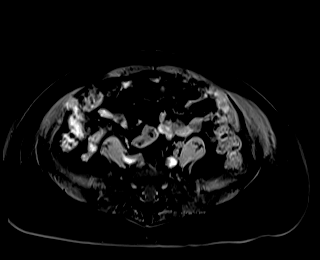
[im 44/88]
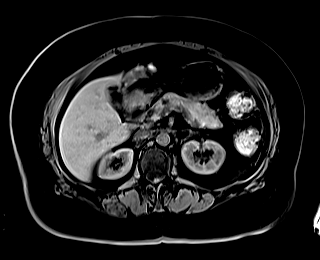
[im 88/88]
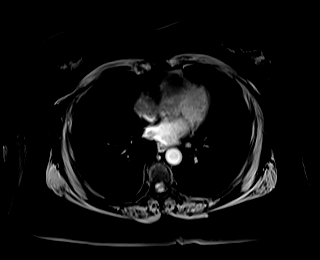

[Series 22: T1 dynamic · axial · 3.0mm · 1.25mm/px · z∈[-41,+220]mm · 3 of 88 slices shown (2 of 9)]
[im 1/88]
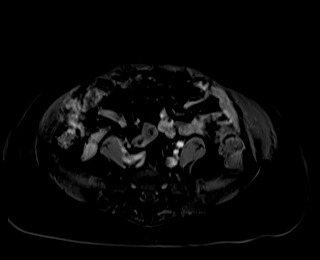
[im 44/88]
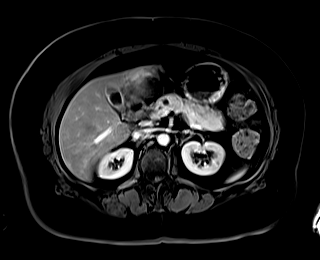
[im 88/88]
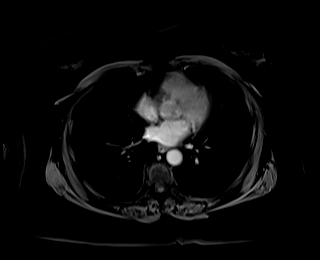

[Series 23: T1 dynamic · axial · 3.0mm · 1.25mm/px · z∈[-41,+220]mm · 3 of 88 slices shown (3 of 9)]
[im 1/88]
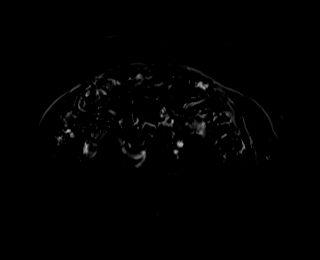
[im 44/88]
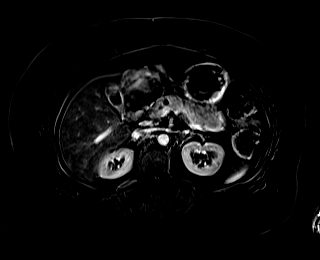
[im 88/88]
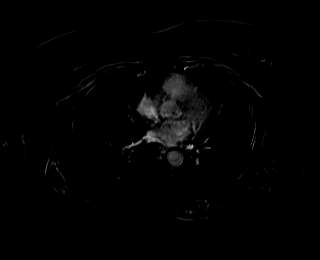

[Series 26: T1 dynamic · axial · 3.0mm · 1.25mm/px · z∈[-41,+220]mm · 3 of 88 slices shown (4 of 9)]
[im 1/88]
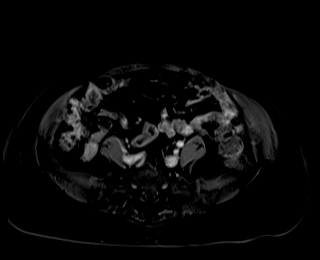
[im 44/88]
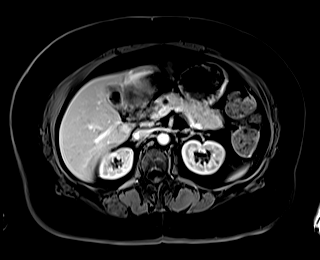
[im 88/88]
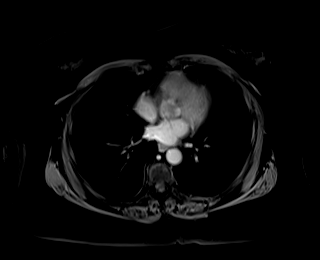

[Series 27: T1 dynamic · axial · 3.0mm · 1.25mm/px · z∈[-41,+220]mm · 3 of 88 slices shown (5 of 9)]
[im 1/88]
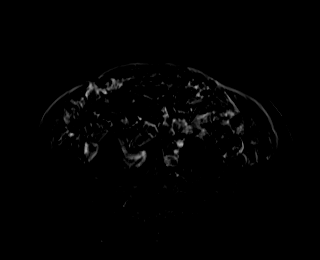
[im 44/88]
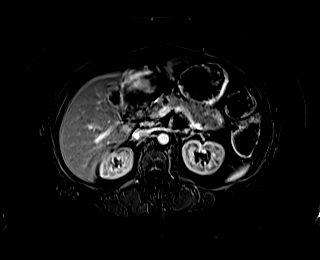
[im 88/88]
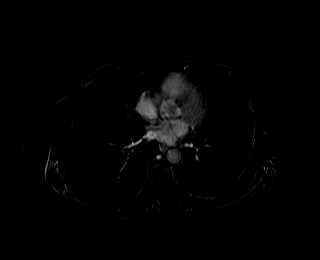

[Series 30: T1 dynamic · axial · 3.0mm · 1.25mm/px · z∈[-41,+220]mm · 3 of 88 slices shown (6 of 9)]
[im 1/88]
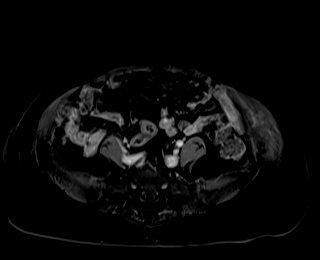
[im 44/88]
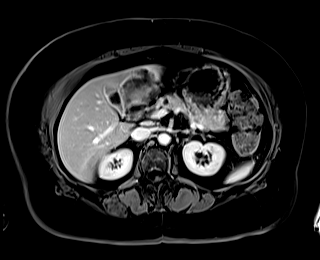
[im 88/88]
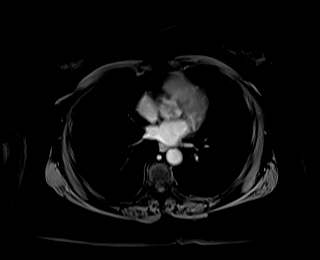

[Series 31: T1 dynamic · axial · 3.0mm · 1.25mm/px · z∈[-41,+220]mm · 3 of 88 slices shown (7 of 9)]
[im 1/88]
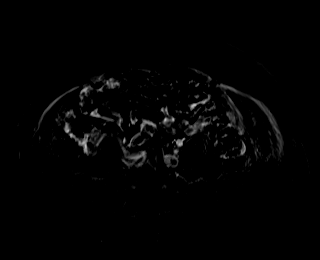
[im 44/88]
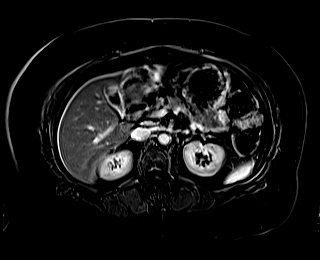
[im 88/88]
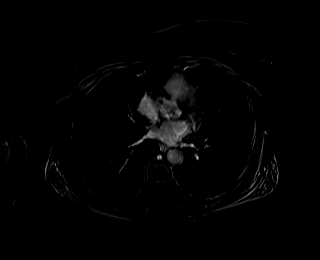

[Series 32: T1 dynamic · coronal · 3.3mm · 1.41mm/px · 3 of 80 slices shown (8 of 9)]
[im 1/80]
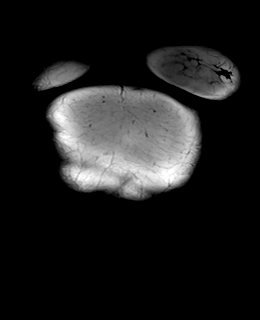
[im 40/80]
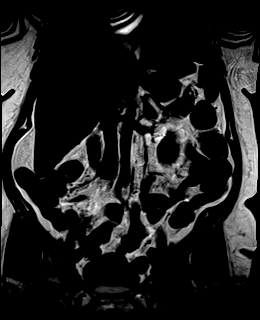
[im 80/80]
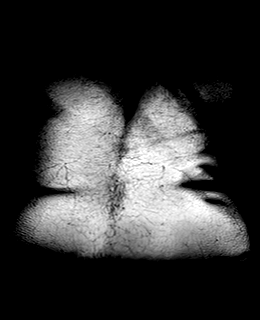

[Series 33: T1 dynamic · coronal · 3.3mm · 1.41mm/px · 2 of 80 slices shown (9 of 9)]
[im 1/80]
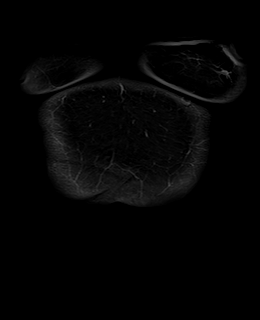
[im 40/80]
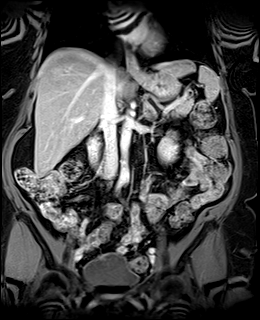

[40 of 48 positions shown; findings below may reference images not displayed]

FINDINGS: Lower chest: Unremarkable.

Hepatobiliary: There is diffuse loss of signal intensity throughout
the hepatic parenchyma on out of phase dual echo images, indicative
of mild hepatic steatosis. No suspicious cystic or solid hepatic
lesions. No intra or extrahepatic biliary ductal dilatation is noted
on MRCP images. Common bile duct measures only 3 mm in the porta
hepatis. There are filling defects within the gallbladder measuring
up to 1.3 cm in diameter, compatible with gallstones. Gallbladder
appears completely contracted around these stones. No
pericholecystic fluid or surrounding inflammatory changes.

Pancreas: In the proximal pancreatic body there is an 0.8 x 0.6 x
1.0 cm T1 hypointense, T2 hyperintense, nonenhancing lesion, which
does not appear to communicate with the main pancreatic duct. No
other pancreatic mass. No pancreatic ductal dilatation. No
peripancreatic fluid collections or inflammatory changes.

Spleen:  Unremarkable.

Adrenals/Urinary Tract: Bilateral kidneys and adrenal glands are
normal in appearance. No hydroureteronephrosis in the visualized
portions of the abdomen.

Stomach/Bowel: Visualized portions are unremarkable.

Vascular/Lymphatic: No aneurysm identified in the visualized
abdominal vasculature. No lymphadenopathy noted in the abdomen.

Other: No significant volume of ascites noted in the visualized
portions of the peritoneal cavity.

Musculoskeletal: No aggressive appearing osseous lesions are noted
in the visualized portions of the skeleton.
IMPRESSION: 1. Study is positive for cholelithiasis, but there are no findings
to suggest acute cholecystitis.
2. Study is negative for choledocholithiasis or evidence of biliary
tract obstruction.
3. Mild hepatic steatosis.
4. Tiny 8 x 6 x 10 mm cystic lesion in the proximal body of the
pancreas, statistically likely a small pancreatic pseudocyst or side
branch IPMN (intraductal papillary mucinous neoplasm). Recommend
follow up pre and post contrast MRI/MRCP or pancreatic protocol CT
in 2 years. This recommendation follows ACR consensus guidelines:
Management of Incidental Pancreatic Cysts: A White Paper of the ACR
Incidental Findings Committee. [HOSPITAL] [ZI];[DATE].

## 2021-07-22 IMAGING — US US ABDOMEN LIMITED
1 series · 15 of 25 positions shown · non-contrast
Comparison: CT abdomen and pelvis [DATE].

CLINICAL DATA: Right upper quadrant pain.

EXAM:
ULTRASOUND ABDOMEN LIMITED RIGHT UPPER QUADRANT

[Series 1: us abdomen limited ruq mc & wl · 15 of 56 slices shown]
[im 1/56]
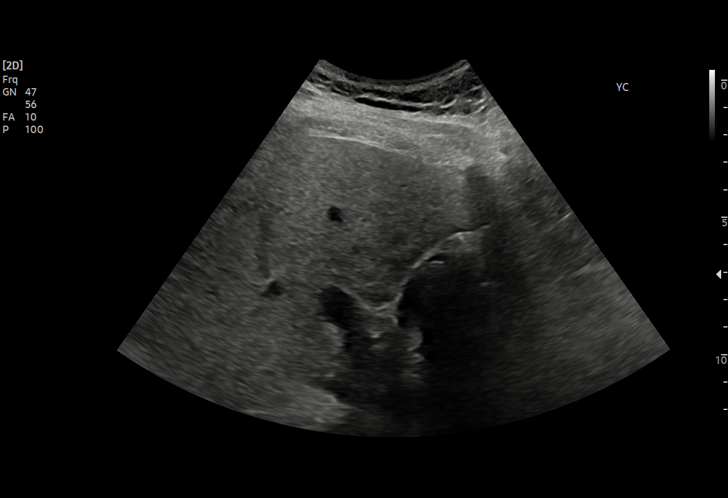
[im 5/56]
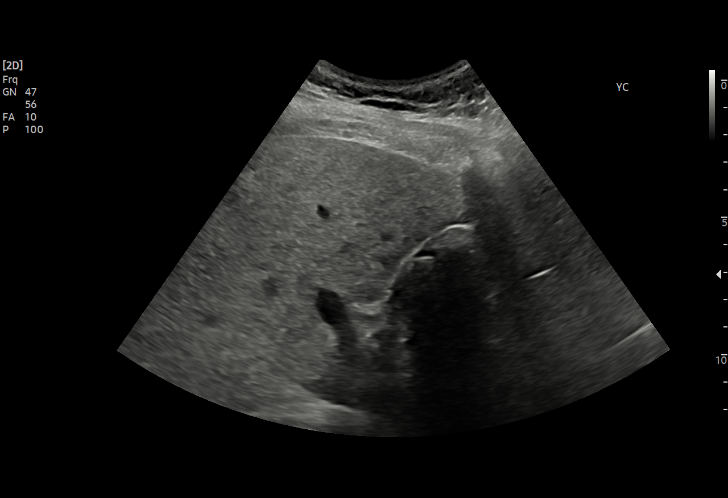
[im 10/56]
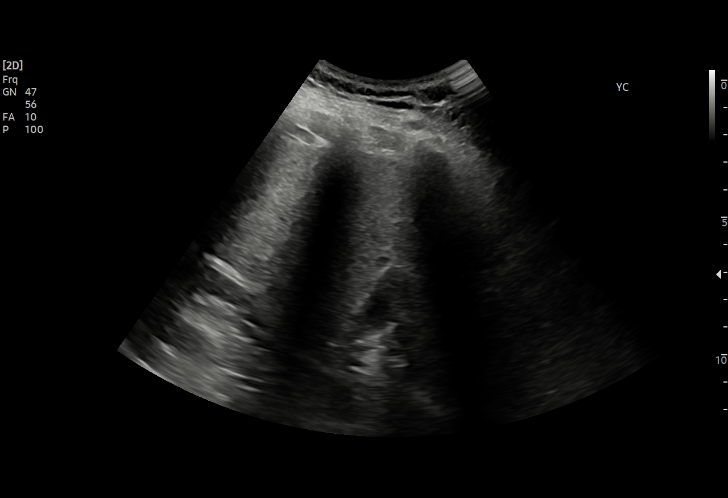
[im 12/56]
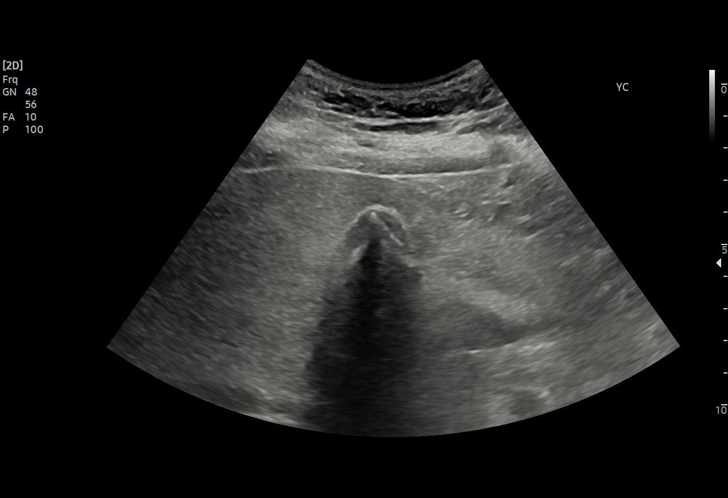
[im 17/56]
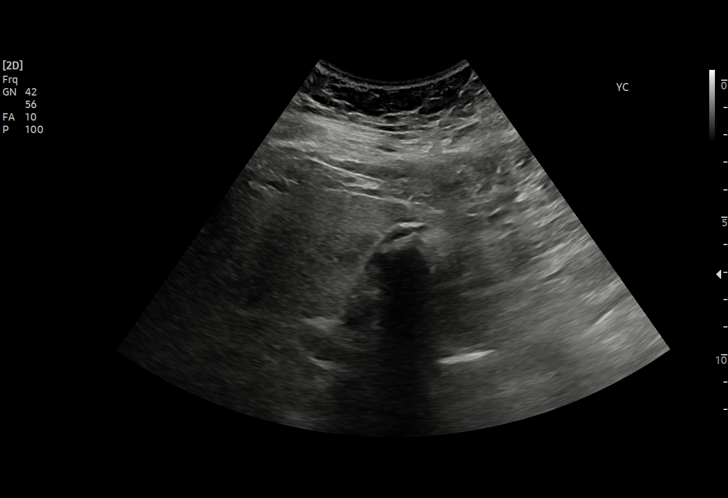
[im 21/56]
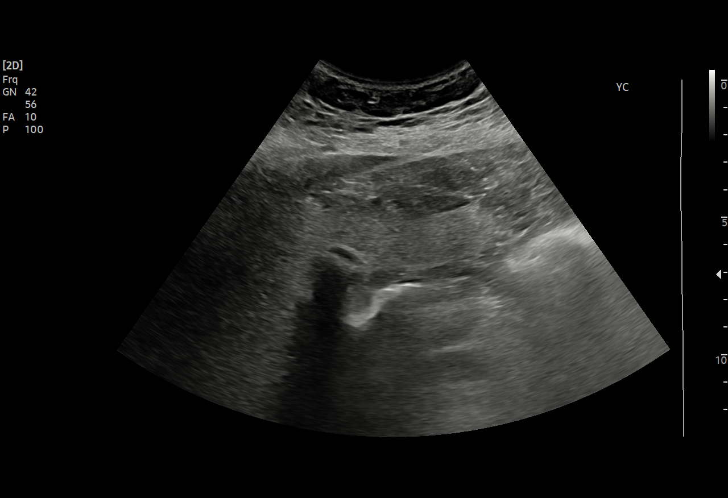
[im 23/56]
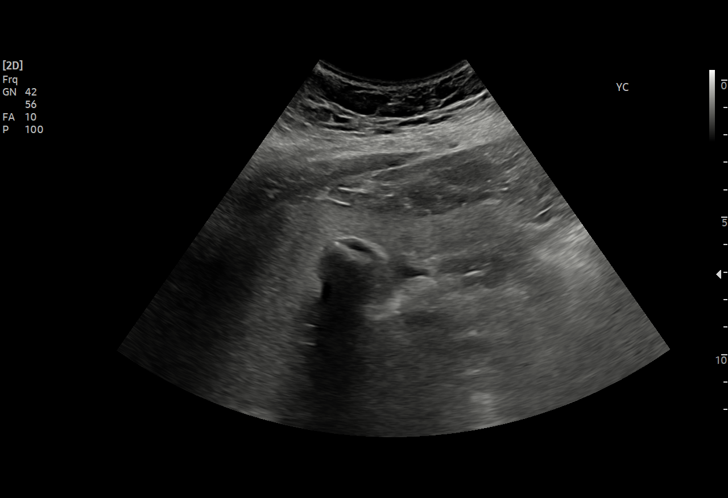
[im 28/56]
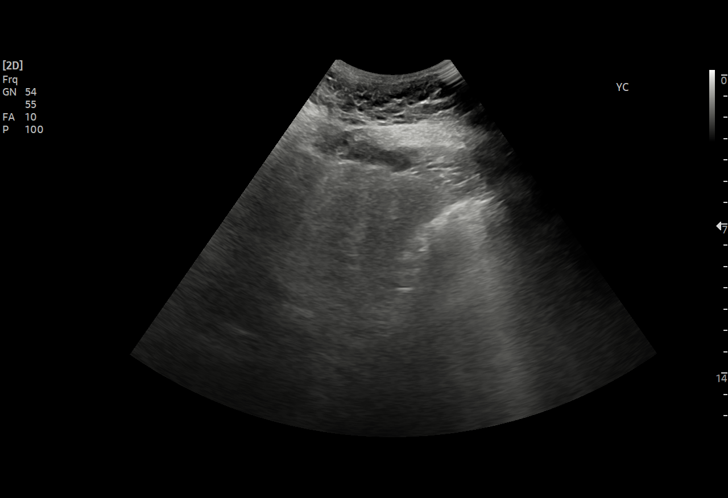
[im 33/56]
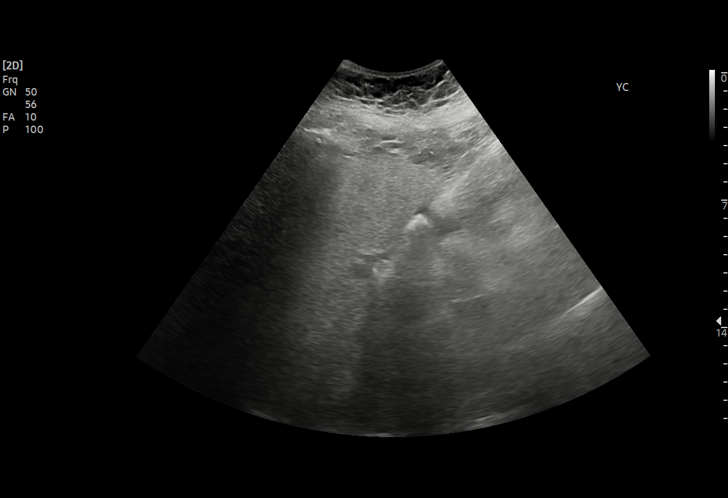
[im 35/56]
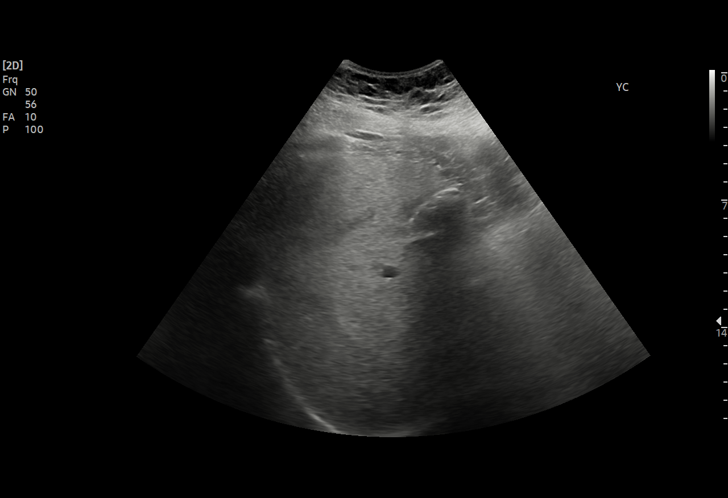
[im 39/56]
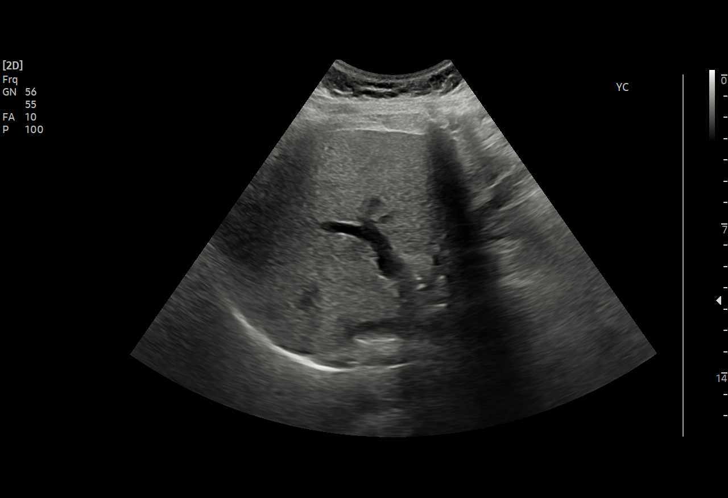
[im 44/56]
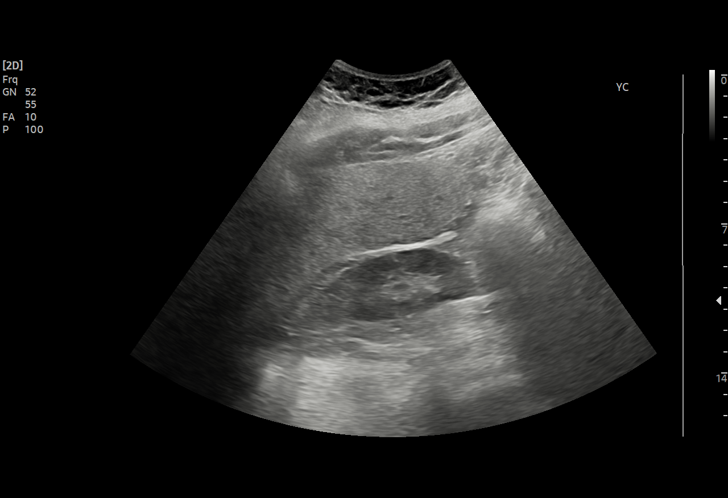
[im 46/56]
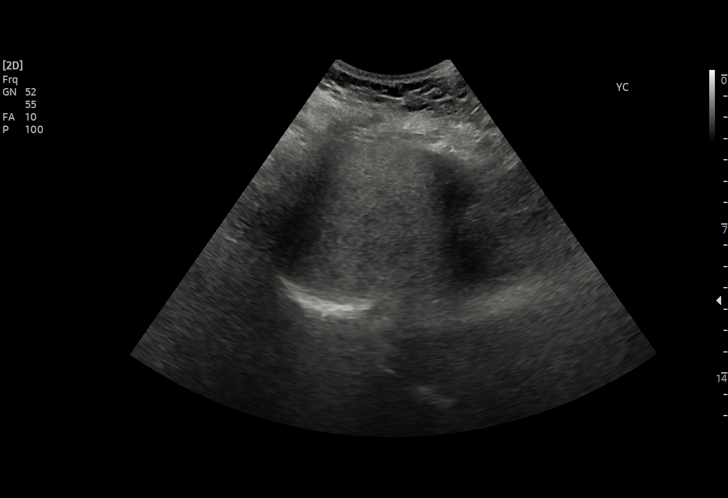
[im 51/56]
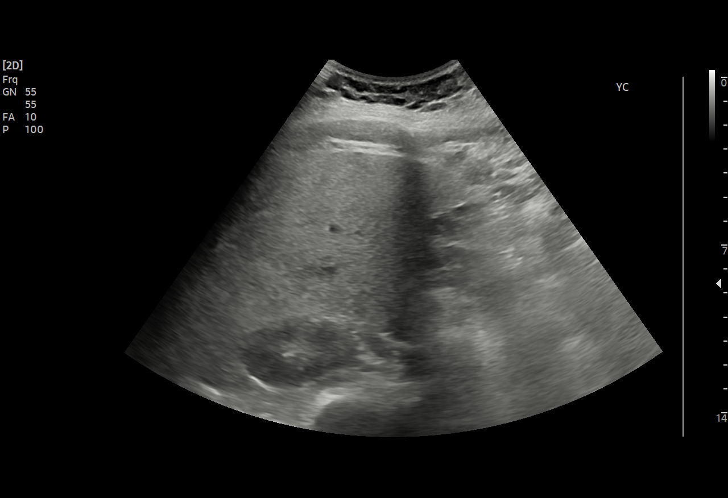
[im 56/56]
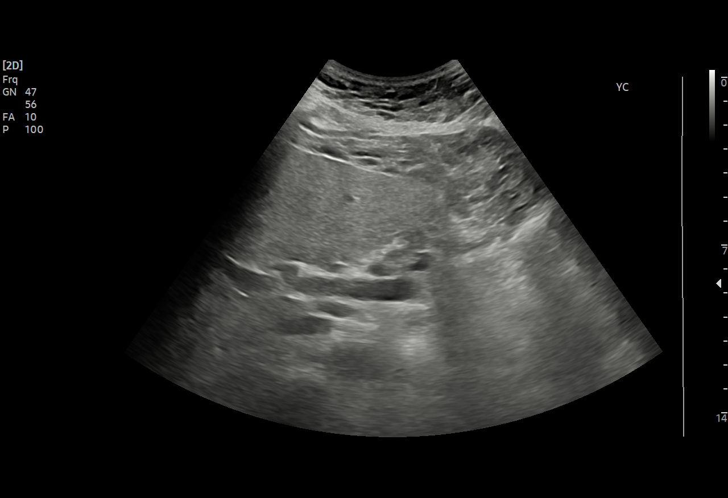

[15 of 25 positions shown; findings below may reference images not displayed]

FINDINGS: Gallbladder:

Numerous gallstones are present. Largest calculus measures 1.4 cm.
There is no gallbladder wall thickening. No sonographic Murphy sign
noted by sonographer.

Common bile duct:

Diameter: 4.6 mm.

Liver:

No focal lesion identified. Increase in parenchymal echogenicity.
Portal vein is patent on color Doppler imaging with normal direction
of blood flow towards the liver.

Other: None.
IMPRESSION: 1. Cholelithiasis. No additional sonographic evidence for acute
cholecystitis.

2.  Echogenic liver likely related to fatty infiltration.

## 2021-07-22 IMAGING — CT CT ABD-PELV W/ CM
2 of 5 series · 15 of 46 positions shown, 17 images · IV contrast (agent unspecified)
Comparison: None.
COMPARISON: None.

Addendum:
CLINICAL DATA: Epigastric pain.  Dizziness.

EXAM:
CT ABDOMEN AND PELVIS WITH CONTRAST
TECHNIQUE: Multidetector CT imaging of the abdomen and pelvis was performed
using the standard protocol following bolus administration of
intravenous contrast.

[Series 2: axial st · axial · 0.83mm/px · z∈[-771,-366]mm · 12 of 95 slices shown, 14 images]
[im 7/95  soft-tissue]
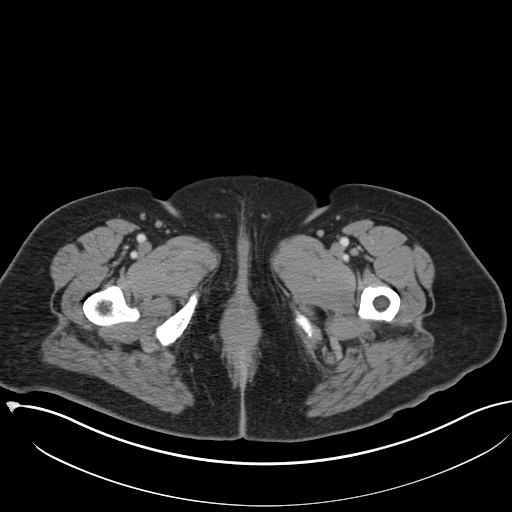
[im 7/95  bone]
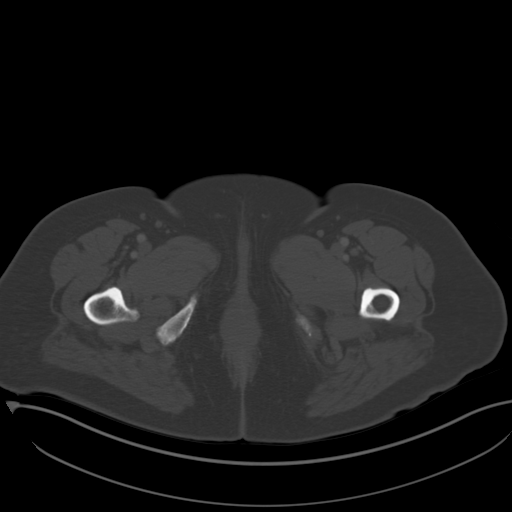
[im 13/95  soft-tissue]
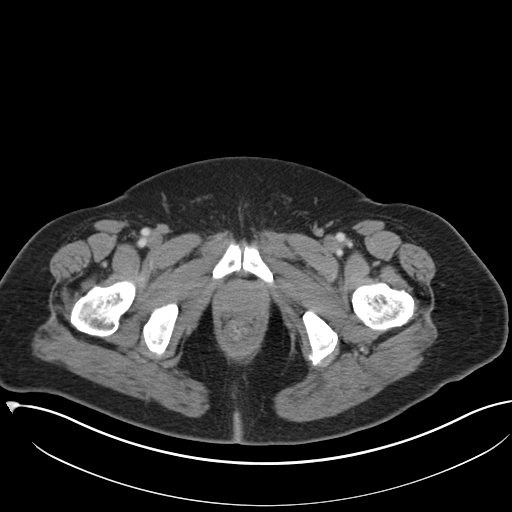
[im 19/95  soft-tissue]
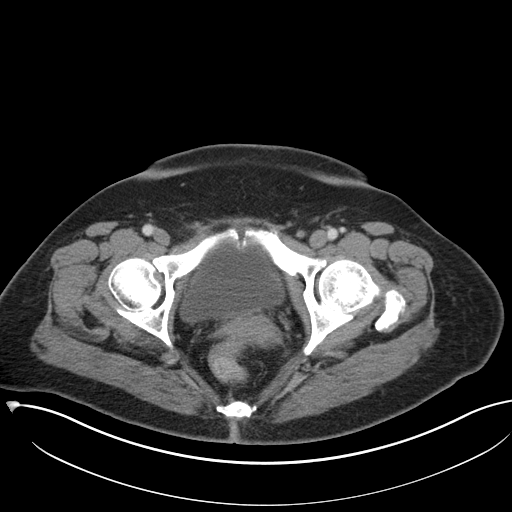
[im 32/95  soft-tissue]
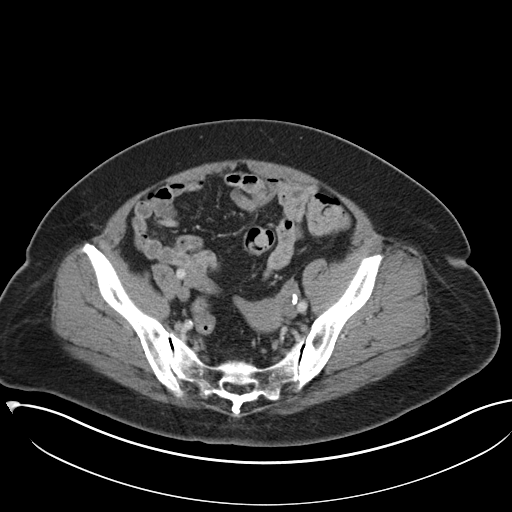
[im 38/95  soft-tissue]
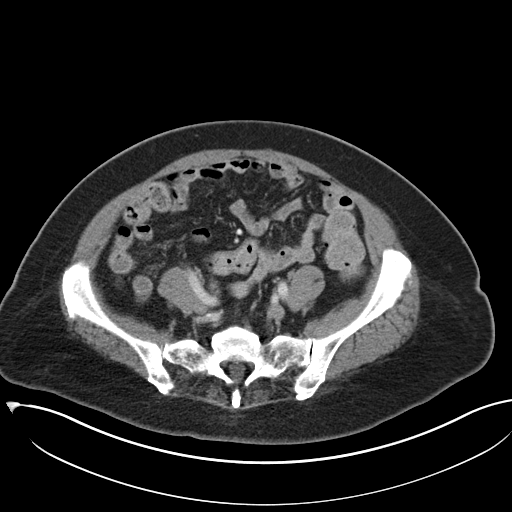
[im 44/95  soft-tissue]
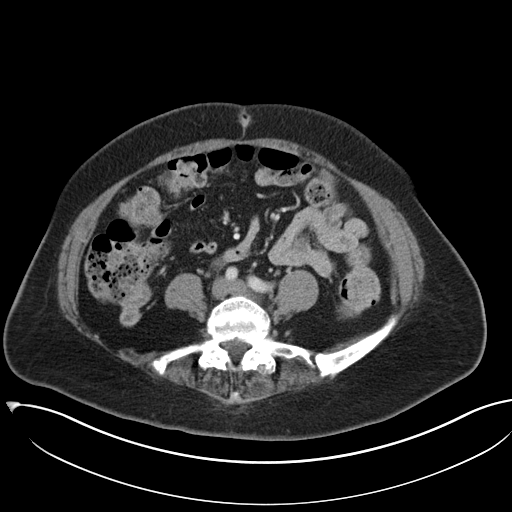
[im 51/95  soft-tissue]
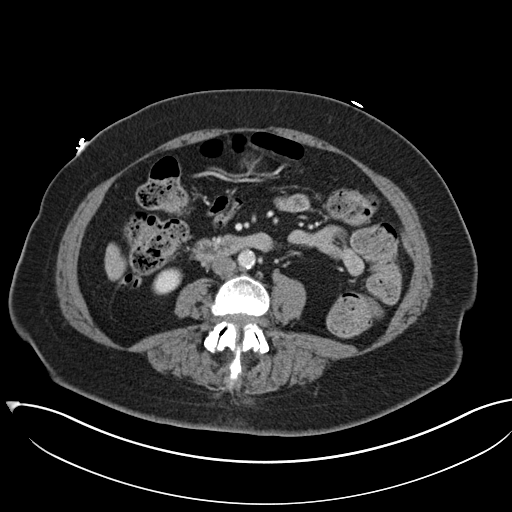
[im 57/95  soft-tissue]
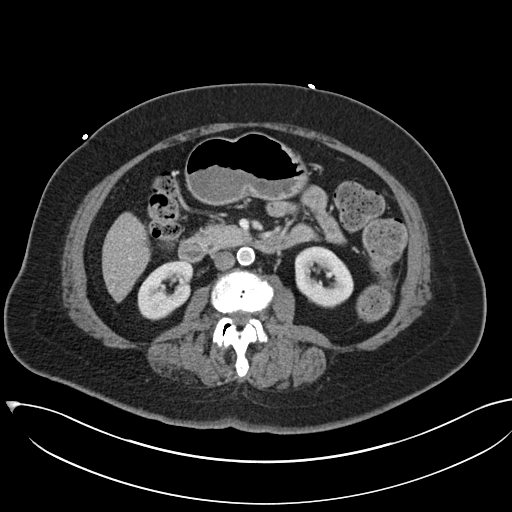
[im 63/95  soft-tissue]
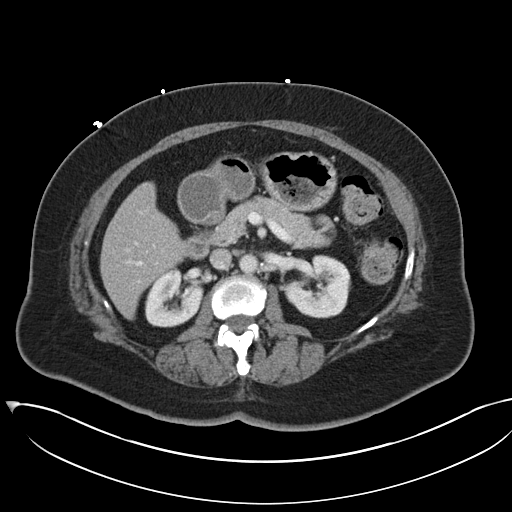
[im 63/95  bone]
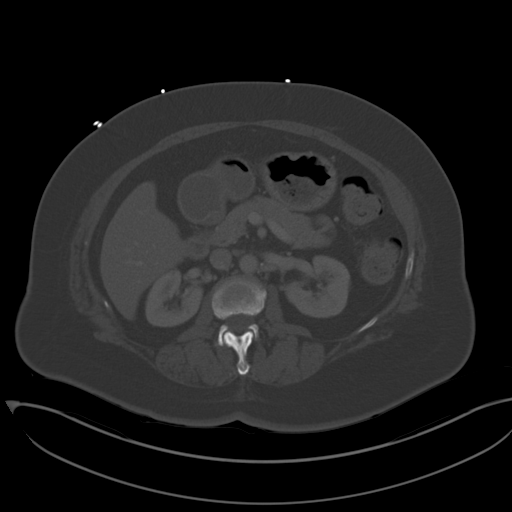
[im 76/95  soft-tissue]
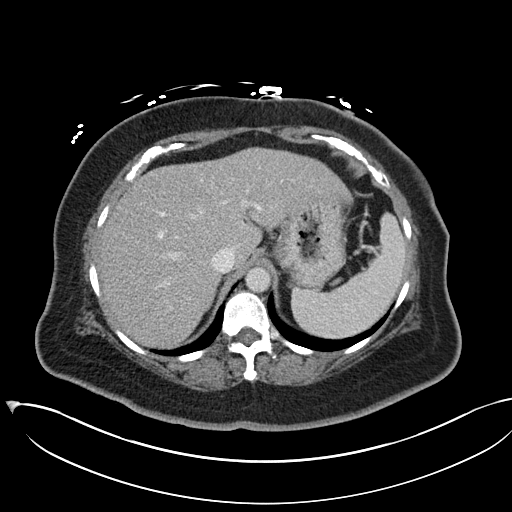
[im 82/95  soft-tissue]
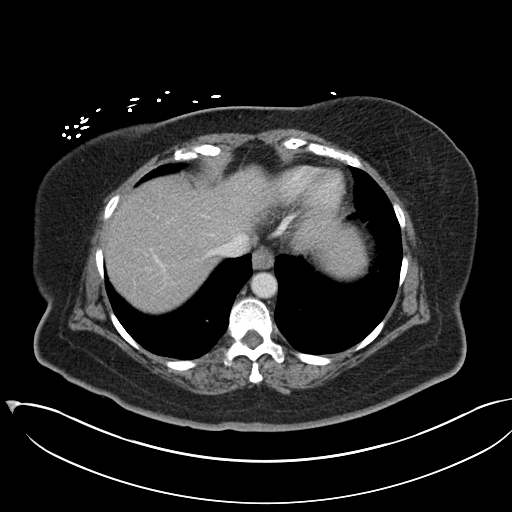
[im 88/95  soft-tissue]
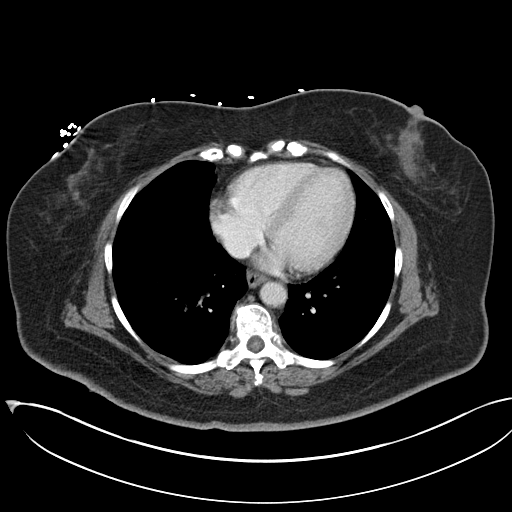

[Series 4: coronal st · coronal · 0.80mm/px · 3 of 135 slices shown]
[im 45/135  soft-tissue]
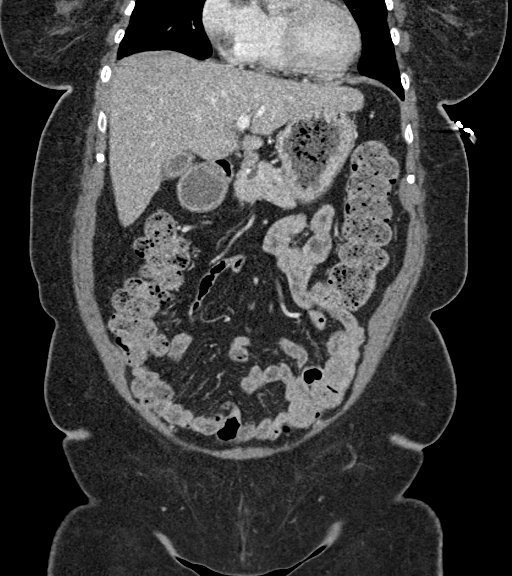
[im 60/135  soft-tissue]
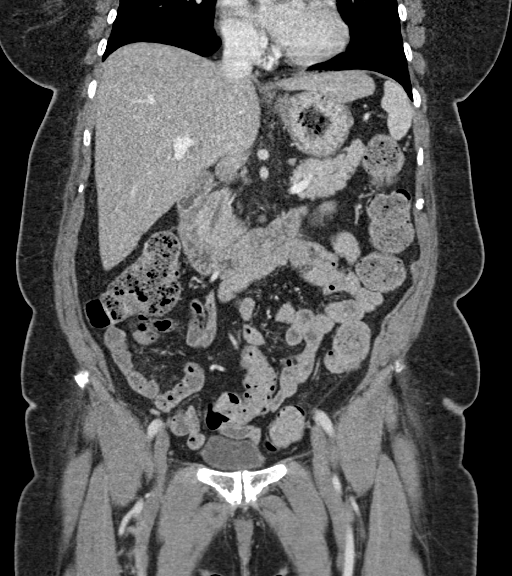
[im 75/135  soft-tissue]
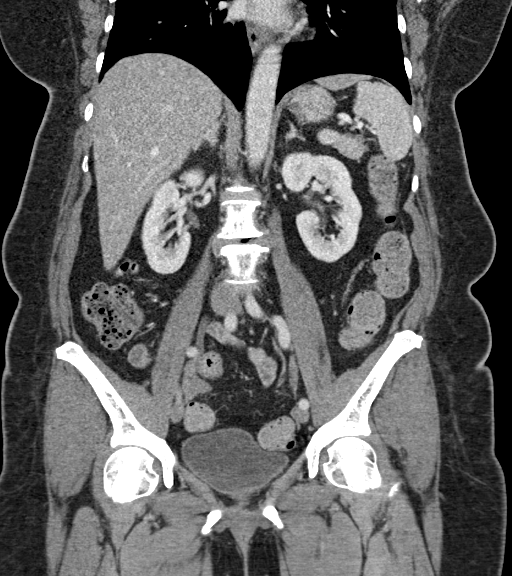

[15 of 46 positions shown; findings below may reference images not displayed]

RADIATION DOSE REDUCTION: This exam was performed according to the
departmental dose-optimization program which includes automated
exposure control, adjustment of the mA and/or kV according to
patient size and/or use of iterative reconstruction technique.

CONTRAST:  100mL OMNIPAQUE IOHEXOL 300 MG/ML  SOLN
FINDINGS: Lower chest: No acute abnormality.

Hepatobiliary: There may be some mild gallbladder wall thickening.
No calcified gallstones or biliary ductal dilatation. There is fatty
infiltration of the liver. No focal liver lesions are seen.

Pancreas: There is a hypodensity in the neck of the pancreas
measuring 7 x 8 mm. No pancreatic ductal dilatation or surrounding
inflammation.

Spleen: Normal in size without focal abnormality.

Adrenals/Urinary Tract: Adrenal glands are unremarkable. Kidneys are
normal, without renal calculi, focal lesion, or hydronephrosis.
Bladder is unremarkable.

Stomach/Bowel: Stomach is within normal limits. Appendix appears
normal. No evidence of bowel wall thickening, distention, or
inflammatory changes.

Vascular/Lymphatic: Aortic atherosclerosis. No enlarged abdominal or
pelvic lymph nodes.

Reproductive: Uterus and bilateral adnexa are unremarkable.

Other: No abdominal wall hernia or abnormality. No abdominopelvic
ascites.

Musculoskeletal: Multilevel degenerative changes affect the spine.
IMPRESSION: 1. Questionable mild gallbladder wall thickening. Please correlate
clinically for cholecystitis. Consider follow-up ultrasound.
2. Fatty infiltration of the liver.
3.  Aortic Atherosclerosis ([LP]-[LP]).
IMPRESSION: 4. Small pancreatic hypodense lesion, indeterminate, possibly
cystic. Recommend further evaluation with pancreatic MRI.

*** End of Addendum ***
RADIATION DOSE REDUCTION: This exam was performed according to the
departmental dose-optimization program which includes automated
exposure control, adjustment of the mA and/or kV according to
patient size and/or use of iterative reconstruction technique.

CONTRAST:  100mL OMNIPAQUE IOHEXOL 300 MG/ML  SOLN
FINDINGS: Lower chest: No acute abnormality.

Hepatobiliary: There may be some mild gallbladder wall thickening.
No calcified gallstones or biliary ductal dilatation. There is fatty
infiltration of the liver. No focal liver lesions are seen.

Pancreas: There is a hypodensity in the neck of the pancreas
measuring 7 x 8 mm. No pancreatic ductal dilatation or surrounding
inflammation.

Spleen: Normal in size without focal abnormality.

Adrenals/Urinary Tract: Adrenal glands are unremarkable. Kidneys are
normal, without renal calculi, focal lesion, or hydronephrosis.
Bladder is unremarkable.

Stomach/Bowel: Stomach is within normal limits. Appendix appears
normal. No evidence of bowel wall thickening, distention, or
inflammatory changes.

Vascular/Lymphatic: Aortic atherosclerosis. No enlarged abdominal or
pelvic lymph nodes.

Reproductive: Uterus and bilateral adnexa are unremarkable.

Other: No abdominal wall hernia or abnormality. No abdominopelvic
ascites.

Musculoskeletal: Multilevel degenerative changes affect the spine.
IMPRESSION: 1. Questionable mild gallbladder wall thickening. Please correlate
clinically for cholecystitis. Consider follow-up ultrasound.
2. Fatty infiltration of the liver.
3.  Aortic Atherosclerosis ([LP]-[LP]).

## 2021-07-22 IMAGING — CT CT HEAD W/O CM
3 series · 16 of 46 positions shown, 19 images · non-contrast
Comparison: MRA [DATE]

CLINICAL DATA: Minor head trauma.  Dizziness.



[Series 2: head wo · axial · 0.46mm/px · z∈[-63,+57]mm · 10 of 29 slices shown, 13 images]
[im 3/29  brain]
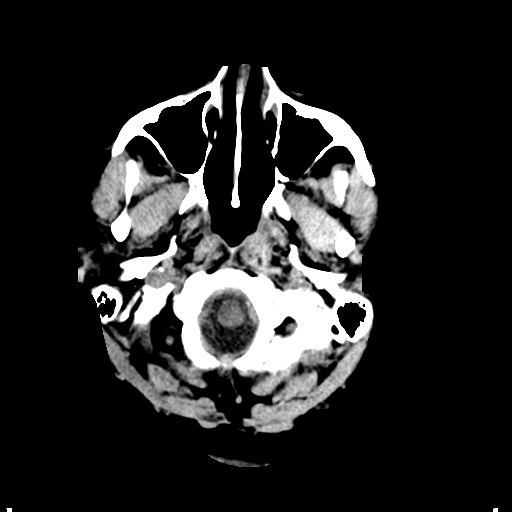
[im 3/29  bone]
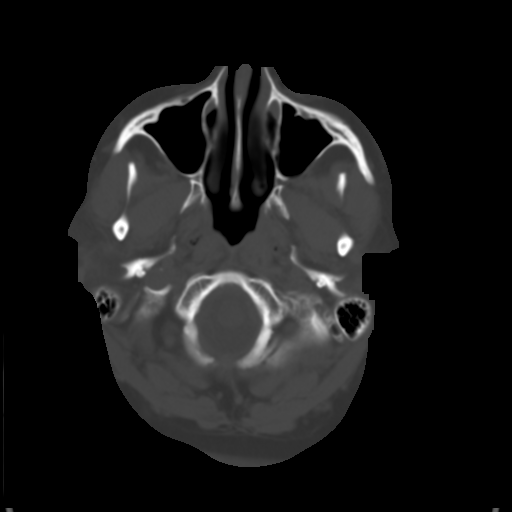
[im 6/29  brain]
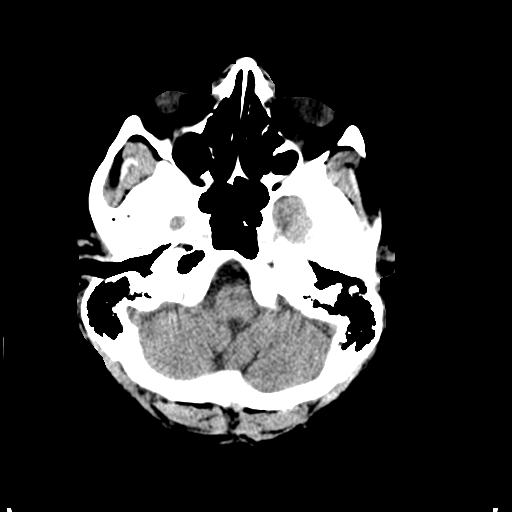
[im 8/29  brain]
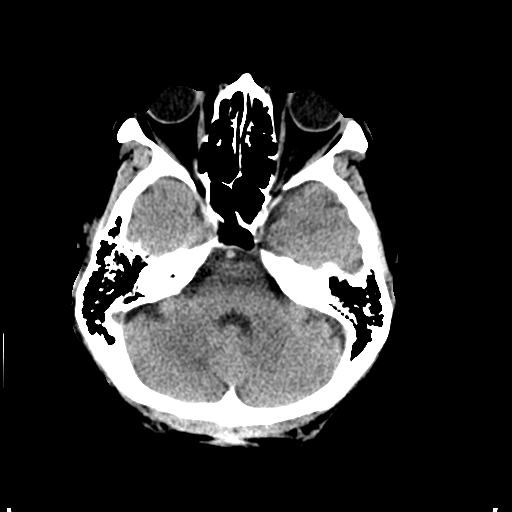
[im 11/29  brain]
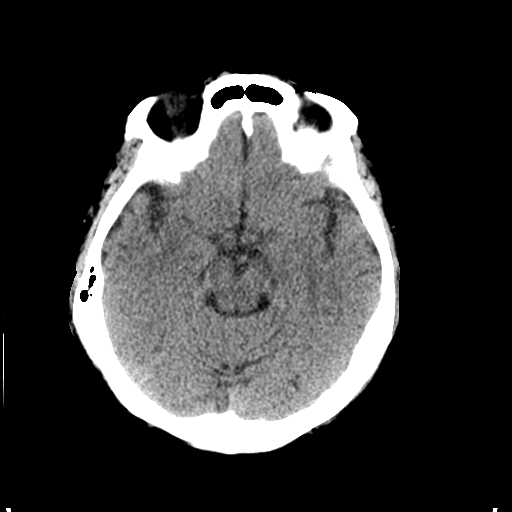
[im 14/29  brain]
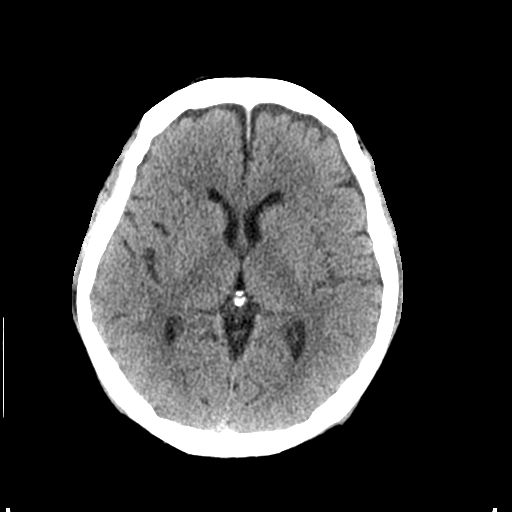
[im 14/29  bone]
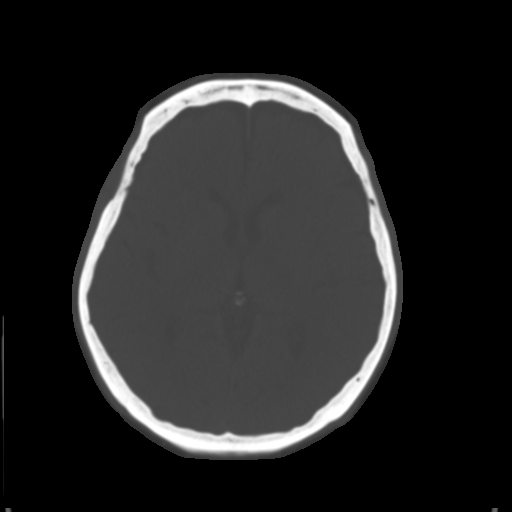
[im 16/29  brain]
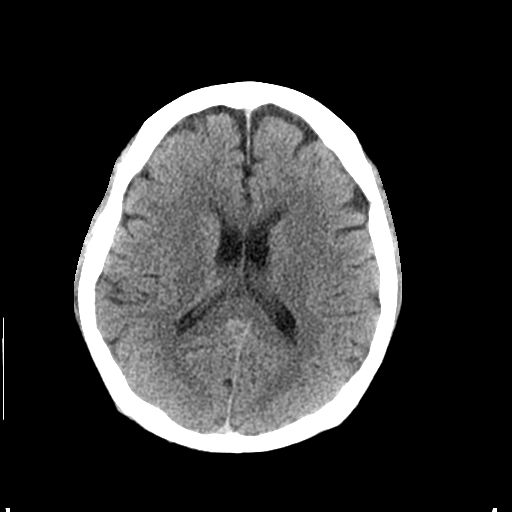
[im 19/29  brain]
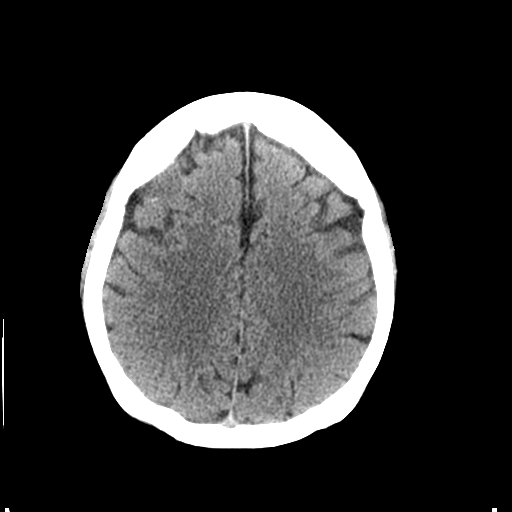
[im 22/29  brain]
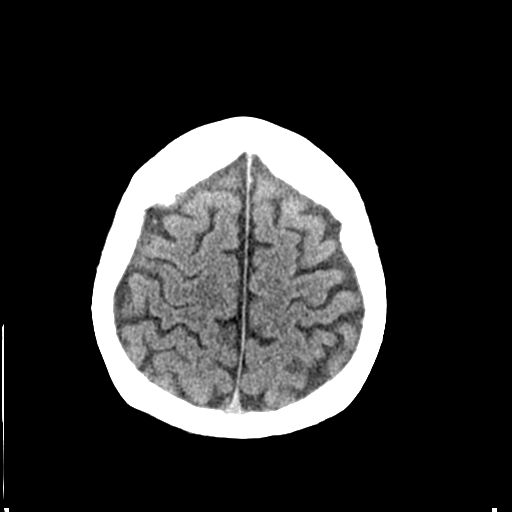
[im 24/29  brain]
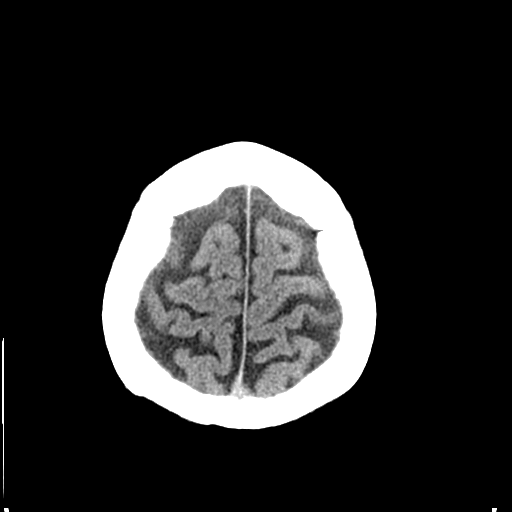
[im 24/29  bone]
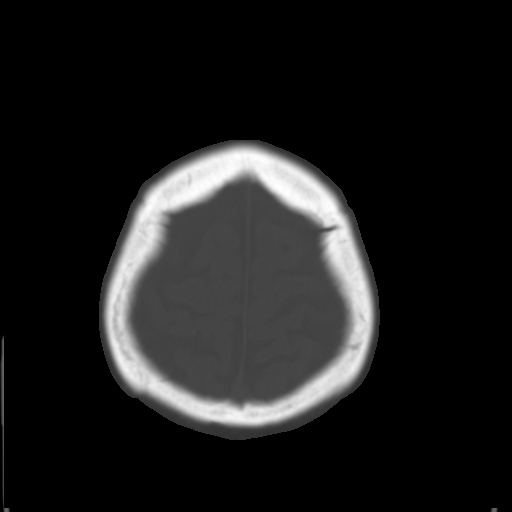
[im 27/29  brain]
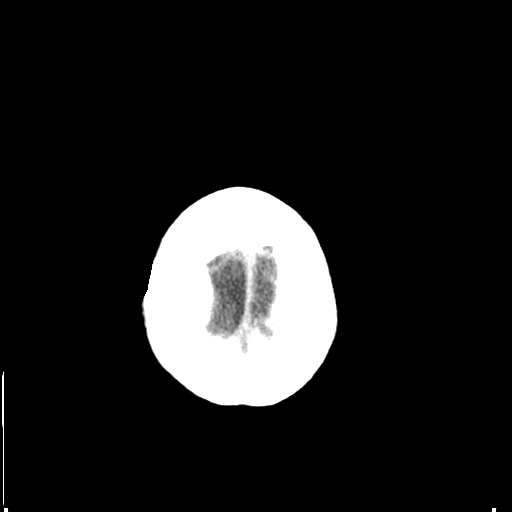

[Series 4: coronal soft tissue · coronal · 0.34mm/px · 3 of 63 slices shown]
[im 21/63  brain]
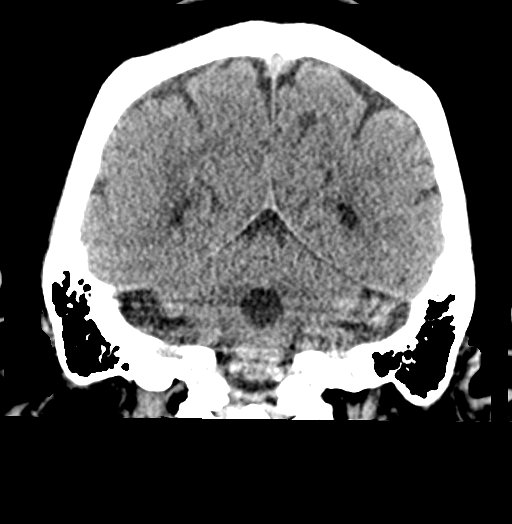
[im 28/63  brain]
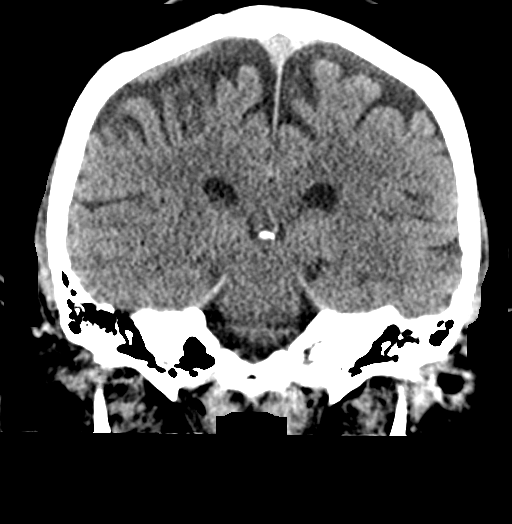
[im 35/63  brain]
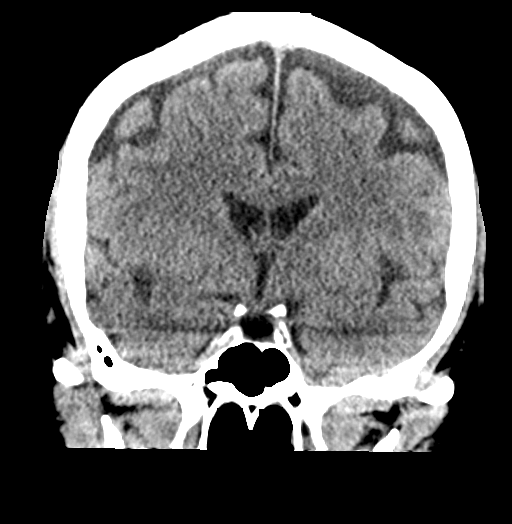

[Series 5: sagittal soft tissue · sagittal · 0.34mm/px · 3 of 58 slices shown]
[im 20/58  brain]
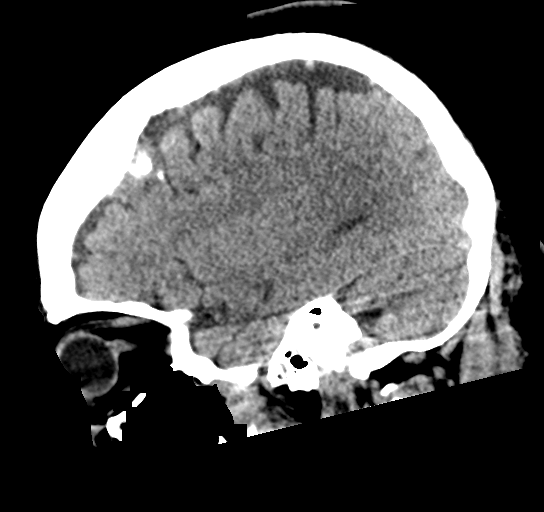
[im 29/58  brain]
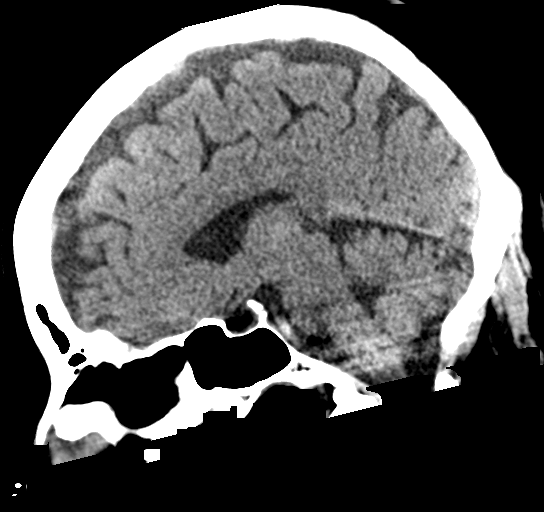
[im 39/58  brain]
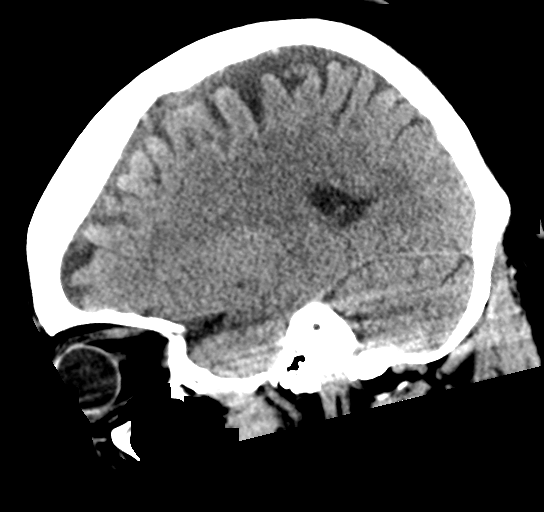

[16 of 46 positions shown; findings below may reference images not displayed]

FINDINGS: Brain: Partially calcified extra-axial lesion along the right
frontal region measuring 2.3 cm maximal diameter. This is likely a
meningioma. No significant mass effect or midline shift. No
ventricular dilatation. Gray-white matter junctions are distinct.
Basal cisterns are not effaced. No abnormal extra-axial fluid
collections. No acute intracranial hemorrhage.

Vascular: No hyperdense vessel or unexpected calcification.

Skull: Calvarium appears intact.

Sinuses/Orbits: Paranasal sinuses and mastoid air cells are clear.

Other: None.
IMPRESSION: 1. No acute intracranial abnormalities.
2. Right frontal extra-axial meningioma.

## 2021-07-22 IMAGING — DX DG CHEST 1V PORT
1 series · 1 of 1 positions shown · non-contrast
Comparison: [DATE]

CLINICAL DATA: Presyncope, dizziness

EXAM:
PORTABLE CHEST 1 VIEW

[chest ap]
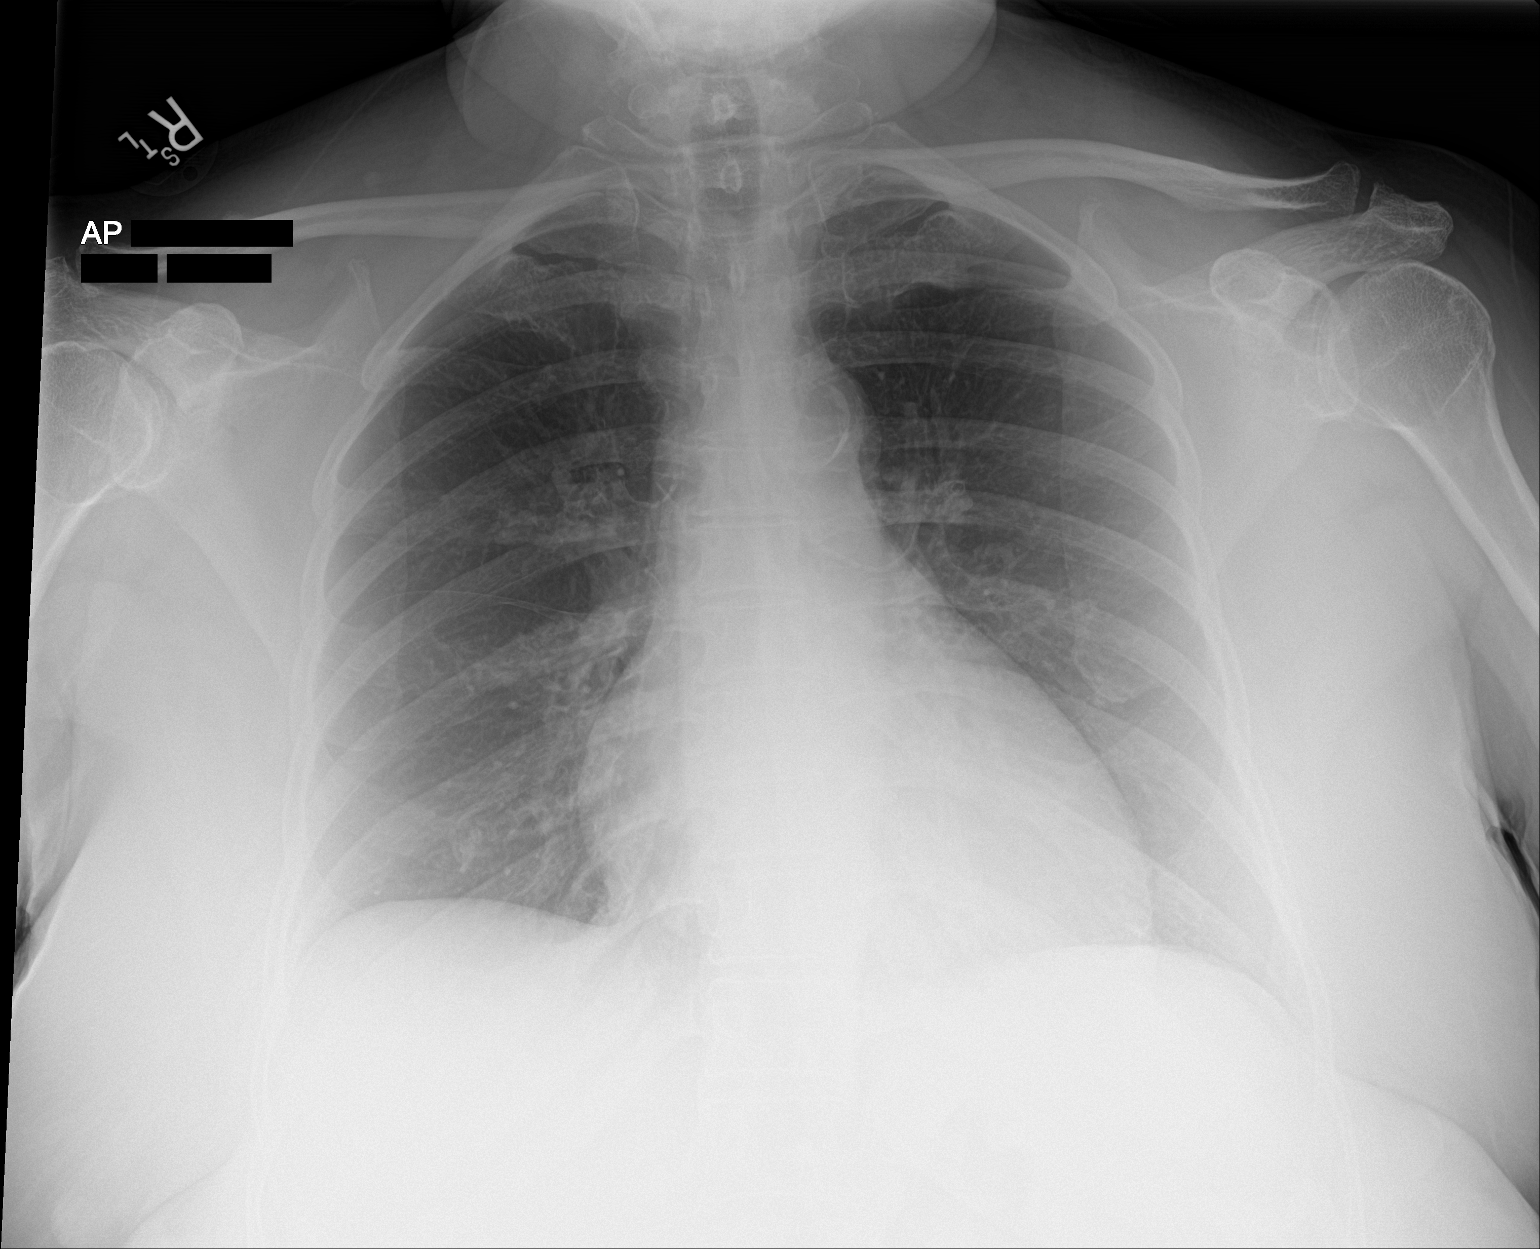

[1 of 1 positions shown; findings below may reference images not displayed]

FINDINGS: The heart size and mediastinal contours are within normal limits.
Both lungs are clear. The visualized skeletal structures are
unremarkable.
IMPRESSION: No active disease.

## 2021-07-22 MED ORDER — IOHEXOL 300 MG/ML  SOLN
100.0000 mL | Freq: Once | INTRAMUSCULAR | Status: AC | PRN
  Administered 2021-07-22: 100 mL via INTRAVENOUS

## 2021-07-22 MED ORDER — ENOXAPARIN SODIUM 40 MG/0.4ML IJ SOSY
40.0000 mg | PREFILLED_SYRINGE | INTRAMUSCULAR | Status: DC
  Administered 2021-07-23 – 2021-07-25 (×3): 40 mg via SUBCUTANEOUS
  Filled 2021-07-22 (×3): qty 0.4

## 2021-07-22 MED ORDER — SODIUM CHLORIDE 0.9 % IV BOLUS
500.0000 mL | Freq: Once | INTRAVENOUS | Status: AC
  Administered 2021-07-22: 500 mL via INTRAVENOUS

## 2021-07-22 MED ORDER — ONDANSETRON HCL 4 MG/2ML IJ SOLN: 4.0000 mg | Freq: Four times a day (QID) | INTRAMUSCULAR | Status: DC | PRN

## 2021-07-22 MED ORDER — ONDANSETRON HCL 4 MG PO TABS: 4.0000 mg | ORAL_TABLET | Freq: Four times a day (QID) | ORAL | Status: DC | PRN

## 2021-07-22 MED ORDER — ACETAMINOPHEN 650 MG RE SUPP: 650.0000 mg | Freq: Four times a day (QID) | RECTAL | Status: DC | PRN

## 2021-07-22 MED ORDER — ACETAMINOPHEN 325 MG PO TABS: 650.0000 mg | ORAL_TABLET | Freq: Four times a day (QID) | ORAL | Status: DC | PRN

## 2021-07-22 MED ORDER — GADOBUTROL 1 MMOL/ML IV SOLN
9.0000 mL | Freq: Once | INTRAVENOUS | Status: AC | PRN
  Administered 2021-07-22: 9 mL via INTRAVENOUS

## 2021-07-22 NOTE — Assessment & Plan Note (Addendum)
-  Pancreatic MRI reviewed. 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

## 2021-07-22 NOTE — ED Notes (Signed)
Pt NAD in bed, a/ox4. Pt states she was told to come to ED for low sodium at 123. Pt states she has been lightheaded x 1 week, otherwise no complaints. Pt denies blurred vision, headache, CP, SOB, ABD pain, n/v/d or GU symptoms. Reports urine as a darker yellow. Started new med this week

## 2021-07-22 NOTE — ED Provider Notes (Signed)
[facility] DEPT Provider Note   CSN: [id number] Arrival date & time: 07/22/21  1808     History  Chief Complaint  Patient presents with   Dizziness   Abnormal Lab   Abdominal Pain   Nausea    Brooke Villa is a 76 y.o. female.  This is a 76 y.o. female with significant medical history as below, including DM, HTN, HLD who presents to the ED with complaint of abnormal labs.  Patient reports she was sent to the ER by PCP secondary to hyponatremia, she was told her sodium was 120.  Labs that were taken approximately 2 weeks ago reported to have a sodium 139 per the patient.  Patient does report 2 days ago she had some nausea and episode of emesis.  Ongoing mild nausea since then.  No further episode of emesis.  She has reduced solid food intake with been tolerating liquids appropriately.  No change in bowel or bladder function.  Does not take any thiazide diuretics. Denies hx of hyponatremia in the past. She has mild epigastric discomfort onset over the past 2 days. Mild cramping/aching.   Pt reports when she was ambulating in the ER earlier this evening she was attempitng to sit down in her chair and she felt light headed and had near syncope, she fell to the ground, unsure if she hit her head, no thinners. Was able to stand after and ambulated with spouse's assistance. No numbness or tingling. Felt light headed prior to fall, no cp or palpitations, no dib.    Past Medical History: No date: Arthritis No date: Diabetes mellitus without complication (HCC) No date: GERD (gastroesophageal reflux disease) No date: Hyperlipemia No date: Hypertension 04/14/2014: Osteoarthritis of left knee No date: Wears glasses No date: Wears partial dentures  Past Surgical History: No date: COLONOSCOPY No date: DILATION AND CURETTAGE OF UTERUS 04/14/2014: PARTIAL KNEE ARTHROPLASTY; Left     Comment:  Procedure: LEFT PARTIAL KNEE REPLACEMENT MEDIAL                COMPARTMENT;  Surgeon: Johnny Bridge, MD;  Location:               [facility];  Service: Orthopedics;                Laterality: Left; No date: TUBAL LIGATION    The history is provided by the patient and the spouse. No language interpreter was used.  Dizziness Associated symptoms: nausea and vomiting   Associated symptoms: no chest pain, no diarrhea, no headaches, no palpitations and no shortness of breath   Abnormal Lab Abdominal Pain Associated symptoms: nausea and vomiting   Associated symptoms: no chest pain, no constipation, no cough, no diarrhea, no dysuria, no fever and no shortness of breath       Home Medications Prior to Admission medications   Medication Sig Start Date End Date Taking? Authorizing Provider  amLODipine (NORVASC) 10 MG tablet Take 10 mg by mouth in the morning. 07/12/21  Yes [provider]  BIOTIN PO Take 1 tablet by mouth daily.   Yes [provider]  CALCIUM-VITAMIN D PO Take 1 tablet by mouth daily.   Yes [provider]  CINNAMON PO Take 2 tablets by mouth daily.   Yes [provider]  Flaxseed, Linseed, (FLAXSEED OIL PO) Take 1 capsule by mouth daily.   Yes [provider]  furosemide (LASIX) 20 MG tablet Take 20 mg by mouth daily as  needed for fluid or edema. 01/28/21  Yes [provider]  meloxicam (MOBIC) 15 MG tablet Take 15 mg by mouth daily as needed for pain.   Yes [provider]  metFORMIN (GLUMETZA) 500 MG (MOD) 24 hr tablet Take 500 mg by mouth every evening.   Yes [provider]  metoprolol succinate (TOPROL-XL) 25 MG 24 hr tablet Take 25 mg by mouth daily.   Yes [provider]  Oxcarbazepine (TRILEPTAL) 300 MG tablet Take 300 mg by mouth in the morning, at noon, and at bedtime. 06/24/21  Yes [provider]  pantoprazole (PROTONIX) 20 MG tablet Take 20 mg by mouth daily. 07/22/21  Yes [provider]  rosuvastatin (CRESTOR) 20 MG  tablet Take 20 mg by mouth at bedtime. 06/07/21  Yes [provider]  telmisartan (MICARDIS) 80 MG tablet Take 80 mg by mouth in the morning. 07/17/21  Yes [provider]  vitamin B-12 (CYANOCOBALAMIN) 1000 MCG tablet Take 1,000 mcg by mouth 3 (three) times a week. MWF   Yes [provider]  vitamin C (ASCORBIC ACID) 500 MG tablet Take 500 mg by mouth every evening.   Yes [provider]      Allergies    Patient has no known allergies.    Review of Systems   Review of Systems  Constitutional:  Positive for appetite change. Negative for activity change and fever.  HENT:  Negative for facial swelling and trouble swallowing.   Eyes:  Negative for discharge and redness.  Respiratory:  Negative for cough and shortness of breath.   Cardiovascular:  Negative for chest pain and palpitations.  Gastrointestinal:  Positive for abdominal pain, nausea and vomiting. Negative for constipation and diarrhea.  Genitourinary:  Negative for dysuria and flank pain.  Musculoskeletal:  Negative for back pain and gait problem.  Skin:  Negative for pallor and rash.  Neurological:  Positive for dizziness. Negative for syncope and headaches.   Physical Exam Updated Vital Signs BP (!) 196/76 (BP Location: Right Arm)    Pulse 63    Temp 98 F (36.7 C) (Oral)    Resp 17    Ht 5' 3"  (1.6 m)    Wt 86.2 kg    SpO2 100%    BMI 33.66 kg/m  Physical Exam Vitals and nursing note reviewed.  Constitutional:      General: She is not in acute distress.    Appearance: Normal appearance. She is well-developed. She is not ill-appearing or diaphoretic.  HENT:     Head: Normocephalic and atraumatic. No raccoon eyes, Battle's sign, right periorbital erythema or left periorbital erythema.     Right Ear: External ear normal.     Left Ear: External ear normal.     Nose: Nose normal.     Mouth/Throat:     Mouth: Mucous membranes are moist.  Eyes:     General: No scleral icterus.       Right  eye: No discharge.        Left eye: No discharge.     Extraocular Movements: Extraocular movements intact.     Pupils: Pupils are equal, round, and reactive to light.  Cardiovascular:     Rate and Rhythm: Normal rate and regular rhythm.     Pulses: Normal pulses.     Heart sounds: Normal heart sounds.  Pulmonary:     Effort: Pulmonary effort is normal. No respiratory distress.     Breath sounds: Normal breath sounds.  Abdominal:  General: Abdomen is flat.     Tenderness: There is abdominal tenderness in the epigastric area.  Musculoskeletal:        General: Normal range of motion.     Cervical back: Normal range of motion.     Right lower leg: No edema.     Left lower leg: No edema.  Skin:    General: Skin is warm and dry.     Capillary Refill: Capillary refill takes less than 2 seconds.  Neurological:     Mental Status: She is alert and oriented to person, place, and time.     GCS: GCS eye subscore is 4. GCS verbal subscore is 5. GCS motor subscore is 6.     Cranial Nerves: Cranial nerves 2-12 are intact. No dysarthria or facial asymmetry.     Sensory: Sensation is intact.     Motor: Motor function is intact. No tremor.     Coordination: Coordination is intact.     Gait: Gait is intact.  Psychiatric:        Mood and Affect: Mood normal.        Behavior: Behavior normal.    ED Results / Procedures / Treatments   Labs (all labs ordered are listed, but only abnormal results are displayed) Labs Reviewed  LIPASE, BLOOD - Abnormal; Notable for the following components:      Result Value   Lipase 74 (*)    All other components within normal limits  COMPREHENSIVE METABOLIC PANEL - Abnormal; Notable for the following components:   Sodium 123 (*)    Chloride 90 (*)    Glucose, Bld 105 (*)    Creatinine, Ser 1.08 (*)    Total Protein 8.2 (*)    AST 53 (*)    ALT 53 (*)    GFR, Estimated 54 (*)    All other components within normal limits  CBC - Abnormal; Notable for the  following components:   RBC 5.40 (*)    MCV 73.9 (*)    MCH 24.8 (*)    All other components within normal limits  URINALYSIS, ROUTINE W REFLEX MICROSCOPIC - Abnormal; Notable for the following components:   Protein, ur 100 (*)    Leukocytes,Ua SMALL (*)    All other components within normal limits  OSMOLALITY - Abnormal; Notable for the following components:   Osmolality 263 (*)    All other components within normal limits  RESP PANEL BY RT-PCR (FLU A&B, COVID) ARPGX2  CREATININE, URINE, RANDOM  NA AND K (SODIUM & POTASSIUM), RAND UR  OSMOLALITY, URINE  DIFFERENTIAL  BASIC METABOLIC PANEL  BASIC METABOLIC PANEL  BASIC METABOLIC PANEL  BASIC METABOLIC PANEL  BASIC METABOLIC PANEL  TROPONIN I (HIGH SENSITIVITY)  TROPONIN I (HIGH SENSITIVITY)    EKG EKG Interpretation  Date/Time:  Monday July 22 2021 20:39:08 EST Ventricular Rate:  57 PR Interval:  212 QRS Duration: 100 QT Interval:  447 QTC Calculation: 436 R Axis:   -7 Text Interpretation: Sinus rhythm Borderline prolonged PR interval no stemi similar to prior Confirmed by Wynona Dove ([id number]) on 07/22/2021 8:59:42 PM  Radiology CT Head Wo Contrast  Result Date: 07/22/2021 CLINICAL DATA:  Minor head trauma.  Dizziness. EXAM: CT HEAD WITHOUT CONTRAST TECHNIQUE: Contiguous axial images were obtained from the base of the skull through the vertex without intravenous contrast. RADIATION DOSE REDUCTION: This exam was performed according to the departmental dose-optimization program which includes automated exposure control, adjustment of the mA and/or kV according to  patient size and/or use of iterative reconstruction technique. COMPARISON:  MRA 06/05/2021 FINDINGS: Brain: Partially calcified extra-axial lesion along the right frontal region measuring 2.3 cm maximal diameter. This is likely a meningioma. No significant mass effect or midline shift. No ventricular dilatation. Sheehan Stacey-white matter junctions are distinct. Basal cisterns  are not effaced. No abnormal extra-axial fluid collections. No acute intracranial hemorrhage. Vascular: No hyperdense vessel or unexpected calcification. Skull: Calvarium appears intact. Sinuses/Orbits: Paranasal sinuses and mastoid air cells are clear. Other: None. IMPRESSION: 1. No acute intracranial abnormalities. 2. Right frontal extra-axial meningioma. Electronically Signed   By: Lucienne Capers M.D.   On: 07/22/2021 21:31   CT ABDOMEN PELVIS W CONTRAST  Addendum Date: 07/22/2021   ADDENDUM REPORT: 07/22/2021 21:52 IMPRESSION: 4. Small pancreatic hypodense lesion, indeterminate, possibly cystic. Recommend further evaluation with pancreatic MRI. Electronically Signed   By: Ronney Asters M.D.   On: 07/22/2021 21:52   Result Date: 07/22/2021 CLINICAL DATA:  Epigastric pain.  Dizziness. EXAM: CT ABDOMEN AND PELVIS WITH CONTRAST TECHNIQUE: Multidetector CT imaging of the abdomen and pelvis was performed using the standard protocol following bolus administration of intravenous contrast. RADIATION DOSE REDUCTION: This exam was performed according to the departmental dose-optimization program which includes automated exposure control, adjustment of the mA and/or kV according to patient size and/or use of iterative reconstruction technique. CONTRAST:  172m OMNIPAQUE IOHEXOL 300 MG/ML  SOLN COMPARISON:  None. FINDINGS: Lower chest: No acute abnormality. Hepatobiliary: There may be some mild gallbladder wall thickening. No calcified gallstones or biliary ductal dilatation. There is fatty infiltration of the liver. No focal liver lesions are seen. Pancreas: There is a hypodensity in the neck of the pancreas measuring 7 x 8 mm. No pancreatic ductal dilatation or surrounding inflammation. Spleen: Normal in size without focal abnormality. Adrenals/Urinary Tract: Adrenal glands are unremarkable. Kidneys are normal, without renal calculi, focal lesion, or hydronephrosis. Bladder is unremarkable. Stomach/Bowel: Stomach is  within normal limits. Appendix appears normal. No evidence of bowel wall thickening, distention, or inflammatory changes. Vascular/Lymphatic: Aortic atherosclerosis. No enlarged abdominal or pelvic lymph nodes. Reproductive: Uterus and bilateral adnexa are unremarkable. Other: No abdominal wall hernia or abnormality. No abdominopelvic ascites. Musculoskeletal: Multilevel degenerative changes affect the spine. IMPRESSION: 1. Questionable mild gallbladder wall thickening. Please correlate clinically for cholecystitis. Consider follow-up ultrasound. 2. Fatty infiltration of the liver. 3.  Aortic Atherosclerosis (ICD10-I70.0). Electronically Signed: By: ARonney AstersM.D. On: 07/22/2021 21:29   DG Chest Portable 1 View  Result Date: 07/22/2021 CLINICAL DATA:  Presyncope, dizziness EXAM: PORTABLE CHEST 1 VIEW COMPARISON:  06/17/2016 FINDINGS: The heart size and mediastinal contours are within normal limits. Both lungs are clear. The visualized skeletal structures are unremarkable. IMPRESSION: No active disease. Electronically Signed   By: AFidela SalisburyM.D.   On: 07/22/2021 19:56   [place]domen Limited RUQ (LIVER/GB)  Result Date: 07/22/2021 CLINICAL DATA:  Right upper quadrant pain. EXAM: ULTRASOUND ABDOMEN LIMITED RIGHT UPPER QUADRANT COMPARISON:  CT abdomen and pelvis 07/22/2021. FINDINGS: Gallbladder: Numerous gallstones are present. Largest calculus measures 1.4 cm. There is no gallbladder wall thickening. No sonographic Murphy sign noted by sonographer. Common bile duct: Diameter: 4.6 mm. Liver: No focal lesion identified. Increase in parenchymal echogenicity. Portal vein is patent on color Doppler imaging with normal direction of blood flow towards the liver. Other: None. IMPRESSION: 1. Cholelithiasis. No additional sonographic evidence for acute cholecystitis. 2.  Echogenic liver likely related to fatty infiltration. Electronically Signed   By: ARonney AstersM.D.   On:  07/22/2021 22:38     Procedures Procedures    Medications Ordered in ED Medications  enoxaparin (LOVENOX) injection 40 mg (has no administration in time range)  acetaminophen (TYLENOL) tablet 650 mg (has no administration in time range)    Or  acetaminophen (TYLENOL) suppository 650 mg (has no administration in time range)  ondansetron (ZOFRAN) tablet 4 mg (has no administration in time range)    Or  ondansetron (ZOFRAN) injection 4 mg (has no administration in time range)  sodium chloride 0.9 % bolus 500 mL (0 mLs Intravenous Stopped 07/22/21 2250)  iohexol (OMNIPAQUE) 300 MG/ML solution 100 mL (100 mLs Intravenous Contrast Given 07/22/21 2059)    ED Course/ Medical Decision Making/ A&P Clinical Course as of 07/22/21 2329  Mon Jul 22, 2021  2147 Gallbladder wall thickening noted on CT, she has proper quadrant pain, LFTs are minimally elevated, alk phos is normal.  Bili normal.  Will obtain R upper quadrant ultrasound. [SG]  2241 [place] with cholelithiasis, no cholecystitis.  [SG]    Clinical Course User Index [SG] Jeanell Sparrow, DO                           Medical Decision Making Amount and/or Complexity of Data Reviewed Labs: ordered. Radiology: ordered.  Risk Prescription drug management. Decision regarding hospitalization.    CC: epig pain, hypo Na, near syncope  This patient presents to the Emergency Department for the above complaint. This involves an extensive number of treatment options and is a complaint that carries with it a high risk of complications and morbidity. Vital signs were reviewed. Serious etiologies considered.  Record review:  Previous records obtained and reviewed   Additional history obtained from spouse  Medical and surgical history as noted above.   Work up as above, notable for:  Labs & imaging results that were available during my care of the patient were reviewed by me and considered in my medical decision making.   I ordered imaging studies which included  CTH, CT a/p, CXR and I independently visualized and interpreted imaging which showed cholelithiasis, pancreatic abnormality.  Cardiac monitoring reviewed and interpreted personally which shows NSR  Social determinants of health include - N/a  EKG stable, trop negative, CTH stable; low suspicion for cardiac source of syncope. Could be secondary to hyponatremia/poor PO last 48 hours.   Abnormal CTAP with possible gallbladder thickening, RUQ [place] negative for acute chole, no ongoing RUQ pain, labs stable. Cholelithiasis w/o cholecystitis. She also has abnormality to pancreas noted on CT, recommend further eval with MRI- will defer to inpatient team regarding this  Management: Patient given small fluid bolus.  Reassessment:  Patient reports she is feeling better.  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  Recommend admission for frequent Na check, fluid restriction.     D/w Dr Alcario Drought who accepts pt for admission.     This chart was dictated using voice recognition software.  Despite best efforts to proofread,  errors can occur which can change the documentation meaning.         Final Clinical Impression(s) / ED Diagnoses Final diagnoses:  Hyponatremia  Near syncope  RUQ pain  Biliary calculus of other site without obstruction  Elevated blood pressure reading    Rx / DC Orders ED Discharge Orders     None         Jeanell Sparrow, DO 07/22/21 2329

## 2021-07-22 NOTE — ED Triage Notes (Signed)
Patient c/o dizziness today. Patient was referred to the ED for a low sodium-120. Patient also c/o mid abdominal pain and nausea. Patient states she vomited x 1 2 days ago .

## 2021-07-22 NOTE — ED Notes (Signed)
Save blue and gold (x2) in main lab

## 2021-07-22 NOTE — ED Notes (Signed)
Pt up to restroom.

## 2021-07-22 NOTE — ED Notes (Signed)
Pt transported to MRI 

## 2021-07-22 NOTE — ED Provider Triage Note (Signed)
Emergency Medicine Provider Triage Evaluation Note  Brooke Villa , a 76 y.o. female  was evaluated in triage.  Pt complains of dizziness.  Patient also had an abnormal lab value of a sodium of 120.  Endorses epigastric pain, no nausea, vomiting, diarrhea..  Review of Systems  Positive: above Negative: above  Physical Exam  BP (!) 202/71 (BP Location: Left Arm)    Pulse 69    Temp 98 F (36.7 C) (Oral)    Resp 16    Ht 5\' 3"  (1.6 m)    Wt 86.2 kg    SpO2 95%    BMI 33.66 kg/m  Gen:   Awake, no distress   Resp:  Normal effort  MSK:   Moves extremities without difficulty  Other:  Abdomen soft, epigastric tenderness  Medical Decision Making  Medically screening exam initiated at 6:54 PM.  Appropriate orders placed.  Khristy Kalan was informed that the remainder of the evaluation will be completed by another provider, this initial triage assessment does not replace that evaluation, and the importance of remaining in the ED until their evaluation is complete.     Melina Copa, PA-C 07/22/21 [phone]

## 2021-07-22 NOTE — ED Notes (Signed)
Patient transported to MRI 

## 2021-07-22 NOTE — Assessment & Plan Note (Addendum)
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  Trileptal has black box warning for hyponatremia.  Apparently Trileptal can directly activate or increase sensitivity of the V2 (vasopressin) receptors. -STOP trileptal -Improved with 1200cc fluid restriction -Na improved to 133 with fluid restriction -recommend repeat BMET in 1-2 weeks with PCP -Have prescribed low dose neurontin instead of trileptal for trigeminal neuralgia until pt can f/u with her specialist next week

## 2021-07-22 NOTE — ED Notes (Signed)
Patient transported to CT 

## 2021-07-23 DIAGNOSIS — I10 Essential (primary) hypertension: Secondary | ICD-10-CM

## 2021-07-23 DIAGNOSIS — E871 Hypo-osmolality and hyponatremia: Secondary | ICD-10-CM

## 2021-07-23 DIAGNOSIS — E119 Type 2 diabetes mellitus without complications: Secondary | ICD-10-CM

## 2021-07-23 DIAGNOSIS — K869 Disease of pancreas, unspecified: Secondary | ICD-10-CM

## 2021-07-23 LAB — BASIC METABOLIC PANEL
Anion gap: 10 (ref 5–15)
Anion gap: 11 (ref 5–15)
Anion gap: 7 (ref 5–15)
Anion gap: 8 (ref 5–15)
Anion gap: 9 (ref 5–15)
BUN: 15 mg/dL (ref 8–23)
BUN: 15 mg/dL (ref 8–23)
BUN: 15 mg/dL (ref 8–23)
BUN: 16 mg/dL (ref 8–23)
BUN: 16 mg/dL (ref 8–23)
CO2: 21 mmol/L — ABNORMAL LOW (ref 22–32)
CO2: 21 mmol/L — ABNORMAL LOW (ref 22–32)
CO2: 23 mmol/L (ref 22–32)
CO2: 24 mmol/L (ref 22–32)
CO2: 24 mmol/L (ref 22–32)
Calcium: 7.9 mg/dL — ABNORMAL LOW (ref 8.9–10.3)
Calcium: 9 mg/dL (ref 8.9–10.3)
Calcium: 9.3 mg/dL (ref 8.9–10.3)
Calcium: 9.3 mg/dL (ref 8.9–10.3)
Calcium: 9.5 mg/dL (ref 8.9–10.3)
Chloride: 101 mmol/L (ref 98–111)
Chloride: 93 mmol/L — ABNORMAL LOW (ref 98–111)
Chloride: 94 mmol/L — ABNORMAL LOW (ref 98–111)
Chloride: 94 mmol/L — ABNORMAL LOW (ref 98–111)
Chloride: 96 mmol/L — ABNORMAL LOW (ref 98–111)
Creatinine, Ser: 0.7 mg/dL (ref 0.44–1.00)
Creatinine, Ser: 0.85 mg/dL (ref 0.44–1.00)
Creatinine, Ser: 0.91 mg/dL (ref 0.44–1.00)
Creatinine, Ser: 1.01 mg/dL — ABNORMAL HIGH (ref 0.44–1.00)
Creatinine, Ser: 1.1 mg/dL — ABNORMAL HIGH (ref 0.44–1.00)
GFR, Estimated: 52 mL/min — ABNORMAL LOW (ref >60)
GFR, Estimated: 58 mL/min — ABNORMAL LOW (ref >60)
GFR, Estimated: >60 mL/min (ref >60)
GFR, Estimated: >60 mL/min (ref >60)
GFR, Estimated: >60 mL/min (ref >60)
Glucose, Bld: 112 mg/dL — ABNORMAL HIGH (ref 70–99)
Glucose, Bld: 114 mg/dL — ABNORMAL HIGH (ref 70–99)
Glucose, Bld: 86 mg/dL (ref 70–99)
Glucose, Bld: 96 mg/dL (ref 70–99)
Glucose, Bld: 97 mg/dL (ref 70–99)
Potassium: 3.3 mmol/L — ABNORMAL LOW (ref 3.5–5.1)
Potassium: 4.2 mmol/L (ref 3.5–5.1)
Potassium: 4.2 mmol/L (ref 3.5–5.1)
Potassium: 4.2 mmol/L (ref 3.5–5.1)
Potassium: 4.4 mmol/L (ref 3.5–5.1)
Sodium: 125 mmol/L — ABNORMAL LOW (ref 135–145)
Sodium: 125 mmol/L — ABNORMAL LOW (ref 135–145)
Sodium: 128 mmol/L — ABNORMAL LOW (ref 135–145)
Sodium: 129 mmol/L — ABNORMAL LOW (ref 135–145)
Sodium: 129 mmol/L — ABNORMAL LOW (ref 135–145)

## 2021-07-23 LAB — GLUCOSE, CAPILLARY
Glucose-Capillary: 108 mg/dL — ABNORMAL HIGH (ref 70–99)
Glucose-Capillary: 114 mg/dL — ABNORMAL HIGH (ref 70–99)

## 2021-07-23 LAB — CBG MONITORING, ED
Glucose-Capillary: 106 mg/dL — ABNORMAL HIGH (ref 70–99)
Glucose-Capillary: 106 mg/dL — ABNORMAL HIGH (ref 70–99)

## 2021-07-23 LAB — HEMOGLOBIN A1C
Hgb A1c MFr Bld: 5.7 % — ABNORMAL HIGH (ref 4.8–5.6)
Mean Plasma Glucose: 116.89 mg/dL

## 2021-07-23 LAB — TROPONIN I (HIGH SENSITIVITY): Troponin I (High Sensitivity): 3 ng/L (ref <18)

## 2021-07-23 MED ORDER — HYDRALAZINE HCL 20 MG/ML IJ SOLN: 10.0000 mg | Freq: Four times a day (QID) | INTRAMUSCULAR | Status: DC | PRN

## 2021-07-23 MED ORDER — METOPROLOL SUCCINATE ER 25 MG PO TB24
25.0000 mg | ORAL_TABLET | Freq: Every day | ORAL | Status: DC
  Administered 2021-07-23 – 2021-07-24 (×2): 25 mg via ORAL
  Filled 2021-07-23 (×4): qty 1

## 2021-07-23 MED ORDER — INSULIN ASPART 100 UNIT/ML IJ SOLN
0.0000 [IU] | Freq: Three times a day (TID) | INTRAMUSCULAR | Status: DC
  Filled 2021-07-23: qty 0.15

## 2021-07-23 MED ORDER — VITAMIN B-12 1000 MCG PO TABS
1000.0000 ug | ORAL_TABLET | ORAL | Status: DC
  Administered 2021-07-24: 1000 ug via ORAL
  Filled 2021-07-23: qty 1

## 2021-07-23 MED ORDER — IRBESARTAN 300 MG PO TABS: 300.0000 mg | ORAL_TABLET | Freq: Every day | ORAL | Status: DC

## 2021-07-23 MED ORDER — PANTOPRAZOLE SODIUM 20 MG PO TBEC
20.0000 mg | DELAYED_RELEASE_TABLET | Freq: Every day | ORAL | Status: DC
  Administered 2021-07-23 – 2021-07-25 (×3): 20 mg via ORAL
  Filled 2021-07-23 (×4): qty 1

## 2021-07-23 MED ORDER — IRBESARTAN 150 MG PO TABS
300.0000 mg | ORAL_TABLET | Freq: Every day | ORAL | Status: DC
  Administered 2021-07-23 – 2021-07-25 (×3): 300 mg via ORAL
  Filled 2021-07-23 (×3): qty 2

## 2021-07-23 MED ORDER — MELOXICAM 15 MG PO TABS
15.0000 mg | ORAL_TABLET | Freq: Every day | ORAL | Status: DC | PRN
  Filled 2021-07-23: qty 1

## 2021-07-23 MED ORDER — METFORMIN HCL ER 500 MG PO TB24: 500.0000 mg | ORAL_TABLET | Freq: Every evening | ORAL | Status: DC

## 2021-07-23 MED ORDER — ROSUVASTATIN CALCIUM 10 MG PO TABS
20.0000 mg | ORAL_TABLET | Freq: Every day | ORAL | Status: DC
  Administered 2021-07-23 – 2021-07-24 (×2): 20 mg via ORAL
  Filled 2021-07-23 (×2): qty 2

## 2021-07-23 MED ORDER — AMLODIPINE BESYLATE 10 MG PO TABS
10.0000 mg | ORAL_TABLET | Freq: Every morning | ORAL | Status: DC
  Administered 2021-07-23 – 2021-07-25 (×3): 10 mg via ORAL
  Filled 2021-07-23: qty 2
  Filled 2021-07-23 (×2): qty 1

## 2021-07-23 NOTE — ED Notes (Signed)
MD Gardner at bedside. 

## 2021-07-23 NOTE — Assessment & Plan Note (Addendum)
Cont amlodipine, and BB, ARB BP stable at present

## 2021-07-23 NOTE — H&P (Signed)
History and Physical    Patient: Brooke Villa VEL:[id number] DOB: February 08, 1946 DOA: 07/22/2021 DOS: the patient was seen and examined on 07/23/2021 PCP: Gweneth Dimitri, MD  Patient coming from: Home  Chief Complaint:  Chief Complaint  Patient presents with   Dizziness   Abnormal Lab   Abdominal Pain   Nausea    HPI: Brooke Villa is a 76 y.o. female with medical history significant of HTN, HLD, DM2.  Pt being treated for trigeminal neuralgia as outpt.  Started on Trileptal on 1/23.  Sodium was normal per husband in Jan (reportedly 139 x2 weeks ago).  Presents to ED with N/V x2 days ago, ongoing nausea since then, mild.  Reduced PO food intake, tolerating liquids intermittently.  No change in bowel or bladder fxn.  Sodium 120 at PCP office today prompting her to be sent to ED.  Not on thiazide diuretics.   Review of Systems: As mentioned in the history of present illness. All other systems reviewed and are negative. Past Medical History:  Diagnosis Date   Arthritis    Diabetes mellitus without complication (HCC)    GERD (gastroesophageal reflux disease)    Hyperlipemia    Hypertension    Osteoarthritis of left knee 04/14/2014   Wears glasses    Wears partial dentures    Past Surgical History:  Procedure Laterality Date   COLONOSCOPY     DILATION AND CURETTAGE OF UTERUS     PARTIAL KNEE ARTHROPLASTY Left 04/14/2014   Procedure: LEFT PARTIAL KNEE REPLACEMENT MEDIAL COMPARTMENT;  Surgeon: Eulas Post, MD;  Location: [facility];  Service: Orthopedics;  Laterality: Left;   TUBAL LIGATION     Social History:  reports that she has never smoked. She does not have any smokeless tobacco history on file. She reports that she does not drink alcohol and does not use drugs.  No Known Allergies  History reviewed. No pertinent family history.  Prior to Admission medications   Medication Sig Start Date End Date Taking? Authorizing Provider  amLODipine (NORVASC) 10  MG tablet Take 10 mg by mouth in the morning. 07/12/21  Yes [provider]  BIOTIN PO Take 1 tablet by mouth daily.   Yes [provider]  CALCIUM-VITAMIN D PO Take 1 tablet by mouth daily.   Yes [provider]  CINNAMON PO Take 2 tablets by mouth daily.   Yes [provider]  Flaxseed, Linseed, (FLAXSEED OIL PO) Take 1 capsule by mouth daily.   Yes [provider]  furosemide (LASIX) 20 MG tablet Take 20 mg by mouth daily as needed for fluid or edema. 01/28/21  Yes [provider]  meloxicam (MOBIC) 15 MG tablet Take 15 mg by mouth daily as needed for pain.   Yes [provider]  metFORMIN (GLUMETZA) 500 MG (MOD) 24 hr tablet Take 500 mg by mouth every evening.   Yes [provider]  metoprolol succinate (TOPROL-XL) 25 MG 24 hr tablet Take 25 mg by mouth daily.   Yes [provider]  pantoprazole (PROTONIX) 20 MG tablet Take 20 mg by mouth daily. 07/22/21  Yes [provider]  rosuvastatin (CRESTOR) 20 MG tablet Take 20 mg by mouth at bedtime. 06/07/21  Yes [provider]  telmisartan (MICARDIS) 80 MG tablet Take 80 mg by mouth in the morning. 07/17/21  Yes [provider]  vitamin B-12 (CYANOCOBALAMIN) 1000 MCG tablet Take 1,000 mcg by mouth 3 (three) times a week. MWF   Yes [provider]  vitamin C (ASCORBIC ACID) 500 MG tablet Take 500 mg by mouth every evening.   Yes [provider]    Physical Exam: Vitals:   07/22/21 1900 07/22/21 2040 07/22/21 2150 07/22/21 2158  BP: (!) 148/70 (!) 162/82  (!) 196/76  Pulse: (!) 53 (!) 55 62 63  Resp: 16 17 16 17   Temp:  98 F (36.7 C)    TempSrc:  Oral    SpO2: 98% 100% 100% 100%  Weight:      Height:       Constitutional: NAD, calm, comfortable Eyes: PERRL, lids and conjunctivae normal ENMT: Mucous membranes are moist. Posterior pharynx clear of any exudate or lesions.Normal dentition.  Neck: normal, supple, no masses,  no thyromegaly Respiratory: clear to auscultation bilaterally, no wheezing, no crackles. Normal respiratory effort. No accessory muscle use.  Cardiovascular: Regular rate and rhythm, no murmurs / rubs / gallops. No extremity edema. 2+ pedal pulses. No carotid bruits.  Abdomen: no tenderness, no masses palpated. No hepatosplenomegaly. Bowel sounds positive.  Musculoskeletal: no clubbing / cyanosis. No joint deformity upper and lower extremities. Good ROM, no contractures. Normal muscle tone.  Skin: no rashes, lesions, ulcers. No induration Neurologic: CN 2-12 grossly intact. Sensation intact, DTR normal. Strength 5/5 in all 4.  Psychiatric: Normal judgment and insight. Alert and oriented x 3. Normal mood.    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 1) STOP trileptal 2) Fluid restricting to 2/23 per day at least for tonight 3) Q4H BMPs for the moment  DM2 (diabetes mellitus, type 2) (HCC) Hold metformin Mod scale SSI AC  HTN (hypertension)- (present on admission) Cont amlodipine, and BB Hold ARB at least for tomorrow, think that trileptal is the more likely cause of her hyponatremia though.  Pancreatic lesion- (present on admission) Ordering pancreatic MRI to further evaluate       Advance Care Planning:   Code Status: Full Code  Consults: Curb sided nephro, but after figuring out the trileptal is very likely to blame, dont feel that they need to formally consult unless she fails to get better with fluid restriction and stopping trileptal  Family Communication: Husband at bedside  Severity of Illness: The appropriate patient status for this patient is INPATIENT. Inpatient status is judged to be reasonable and necessary in order to provide the required  intensity of service to ensure the patient's safety. The patient's presenting symptoms, physical exam findings, and initial radiographic and laboratory data in the context of their chronic comorbidities is felt to place them at high risk for further clinical deterioration. Furthermore, it is not anticipated that the patient will be medically stable for discharge from the hospital within 2 midnights of admission.   * I certify that at the point of admission it is my clinical judgment that the patient will require inpatient hospital care spanning beyond 2 midnights from the point of admission due to high intensity of service, high risk for further deterioration and high frequency of surveillance required.*  Author: ., DO 07/23/2021 12:26 AM  For on call review [email or web address].

## 2021-07-23 NOTE — Progress Notes (Signed)
I have seen and assessed patient and I agree with Dr. Juleen China assessment and plan.  Patient is a 76 year old female history of hypertension, hyperlipidemia, type 2 diabetes being treated on Trileptal for trigeminal neuralgia as outpatient which started 06/2021 with last sodium noted normal in January at 139 approximately 2 weeks prior to admission.  Patient presented to the ED with nausea vomiting x2 days, decreased oral intake, tolerating liquids intermittently, no change in bowel or bladder function seen at PCPs office sodium noted at 123 patient sent to the ED for further evaluation and management.  Patient seen in the ED urine studies as well as serum osmolality suspicious for SIADH felt secondary to Trileptal.  Trileptal discontinued, patient placed on fluid restriction at 1.2 L/day, Q4 BMET obtained, admitting physician curb sided nephrology who are in agreement with current recommendations and if sodium level worsens then would obtain a formal consult.  Patient improving clinically, continue current treatment plan.  Follow-up.  Will need outpatient follow-up for further management of trigeminal neuralgia.  Will recommend no resumption of Trileptal.  No charge.

## 2021-07-23 NOTE — Assessment & Plan Note (Addendum)
Hold metformin Mod scale SSI AC while in hospital Glycemic trends are stable

## 2021-07-23 NOTE — ED Notes (Signed)
Hospitalist at the bedside 

## 2021-07-24 ENCOUNTER — Inpatient Hospital Stay (HOSPITAL_COMMUNITY): Payer: Medicare Other

## 2021-07-24 DIAGNOSIS — R030 Elevated blood-pressure reading, without diagnosis of hypertension: Secondary | ICD-10-CM

## 2021-07-24 LAB — GLUCOSE, CAPILLARY
Glucose-Capillary: 101 mg/dL — ABNORMAL HIGH (ref 70–99)
Glucose-Capillary: 110 mg/dL — ABNORMAL HIGH (ref 70–99)
Glucose-Capillary: 113 mg/dL — ABNORMAL HIGH (ref 70–99)
Glucose-Capillary: 89 mg/dL (ref 70–99)

## 2021-07-24 LAB — BASIC METABOLIC PANEL
Anion gap: 10 (ref 5–15)
BUN: 19 mg/dL (ref 8–23)
CO2: 18 mmol/L — ABNORMAL LOW (ref 22–32)
Calcium: 9.3 mg/dL (ref 8.9–10.3)
Chloride: 98 mmol/L (ref 98–111)
Creatinine, Ser: 0.9 mg/dL (ref 0.44–1.00)
GFR, Estimated: >60 mL/min (ref >60)
Glucose, Bld: 100 mg/dL — ABNORMAL HIGH (ref 70–99)
Potassium: 4.9 mmol/L (ref 3.5–5.1)
Sodium: 126 mmol/L — ABNORMAL LOW (ref 135–145)

## 2021-07-24 IMAGING — US US ABDOMEN COMPLETE
1 series · 15 of 25 positions shown · non-contrast
Comparison: MRI 2 [DATE], ultrasound [DATE], CT [DATE]

CLINICAL DATA: Mid epigastric pain.  Known gallstones.

EXAM:
ABDOMEN ULTRASOUND COMPLETE

[Series 1: us abdomen complete mc & wl · 15 of 107 slices shown]
[im 1/107]
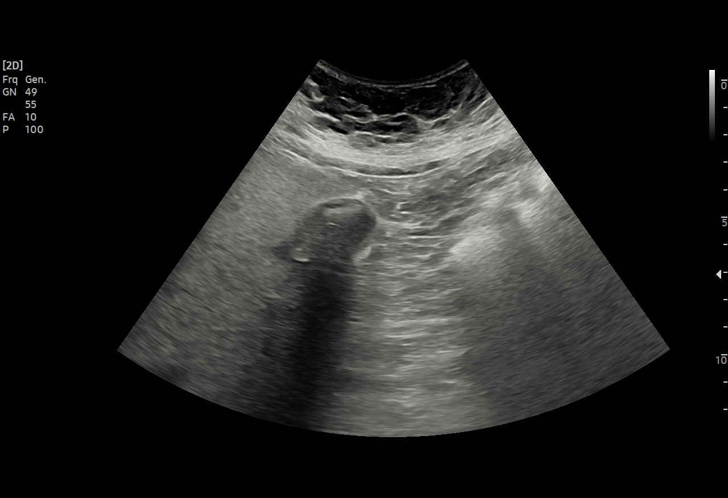
[im 9/107]
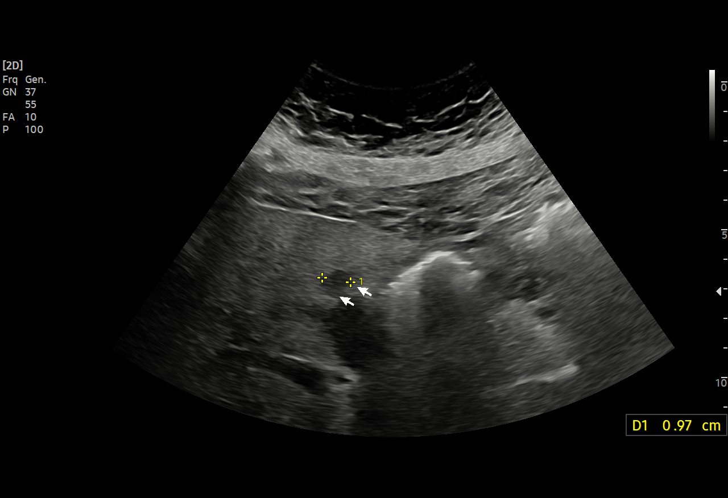
[im 18/107]
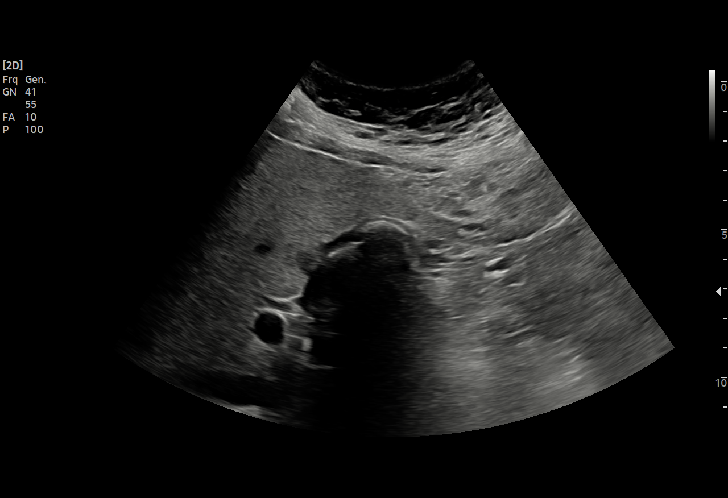
[im 23/107]
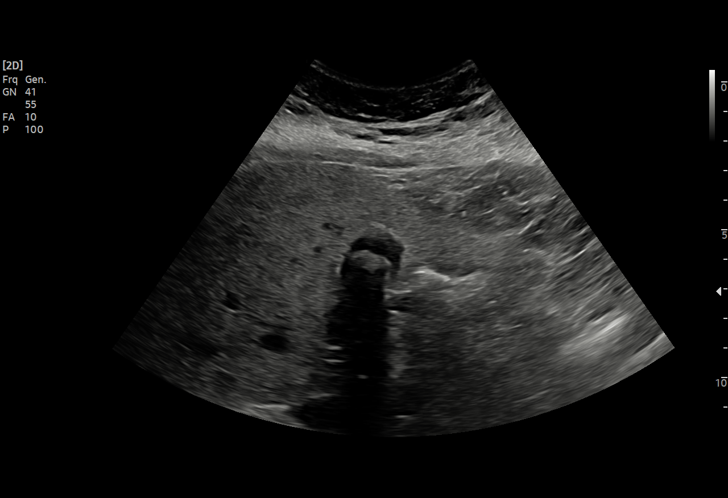
[im 31/107]
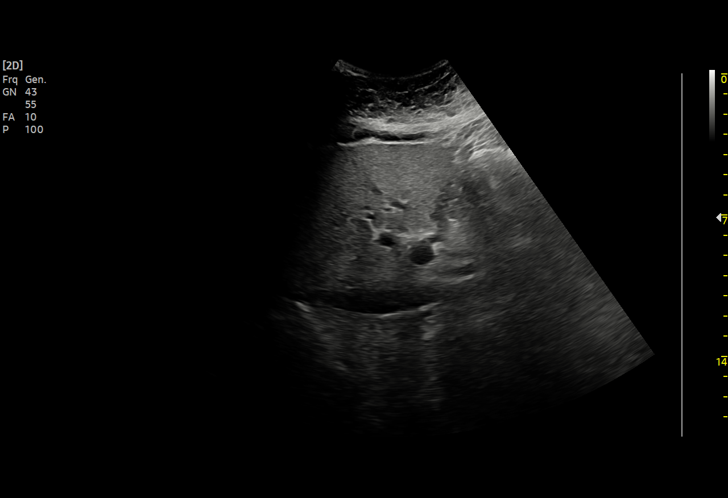
[im 40/107]
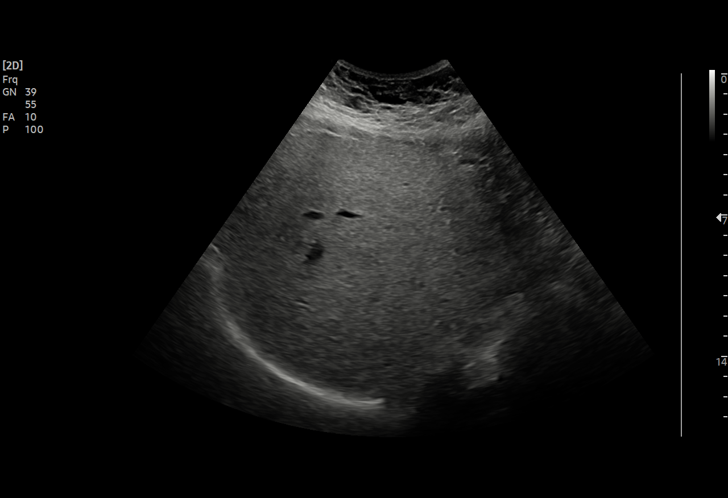
[im 45/107]
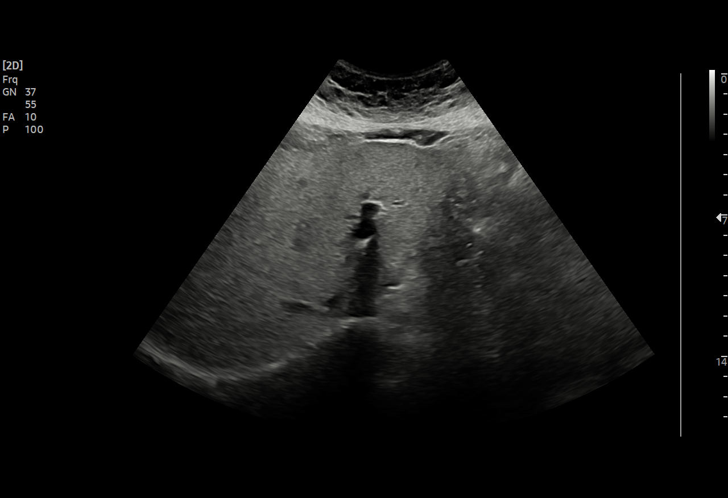
[im 54/107]
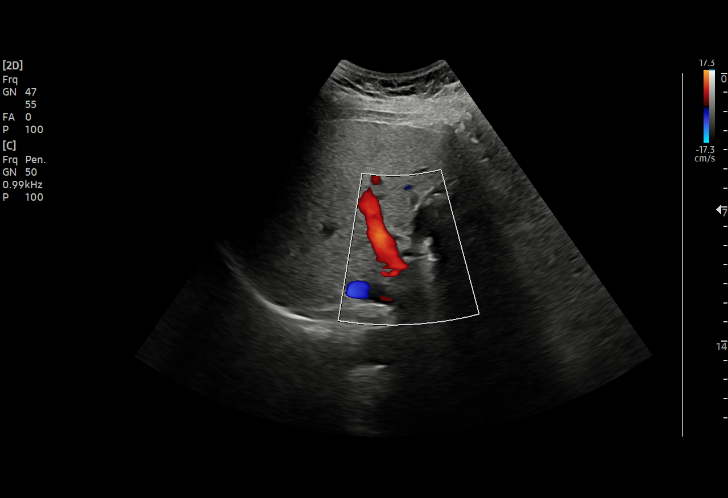
[im 62/107]
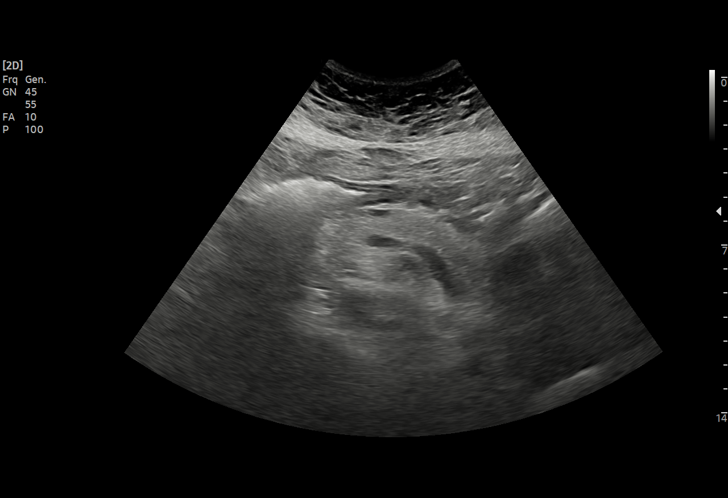
[im 67/107]
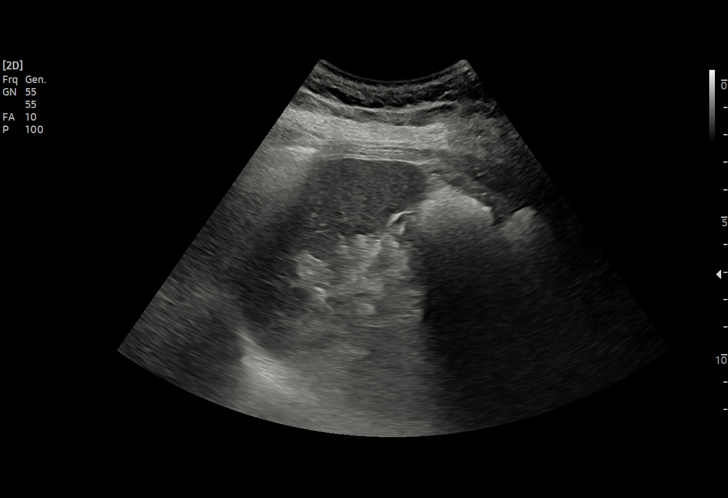
[im 76/107]
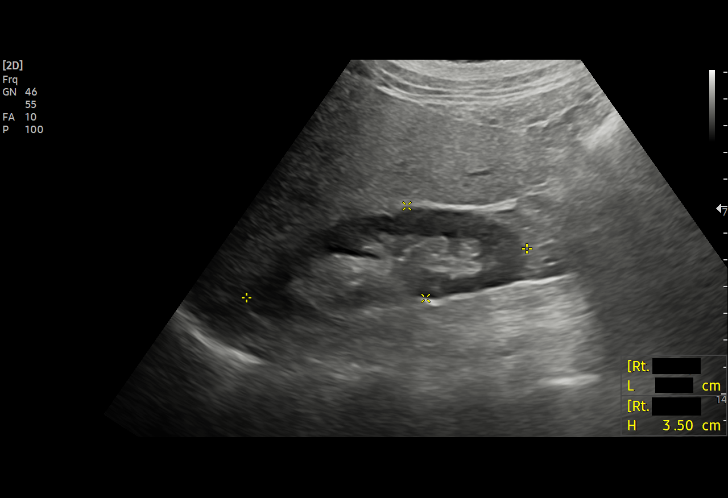
[im 84/107]
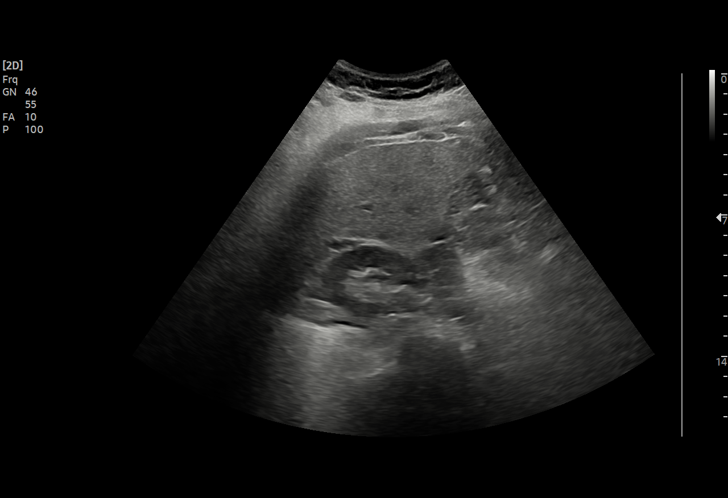
[im 89/107]
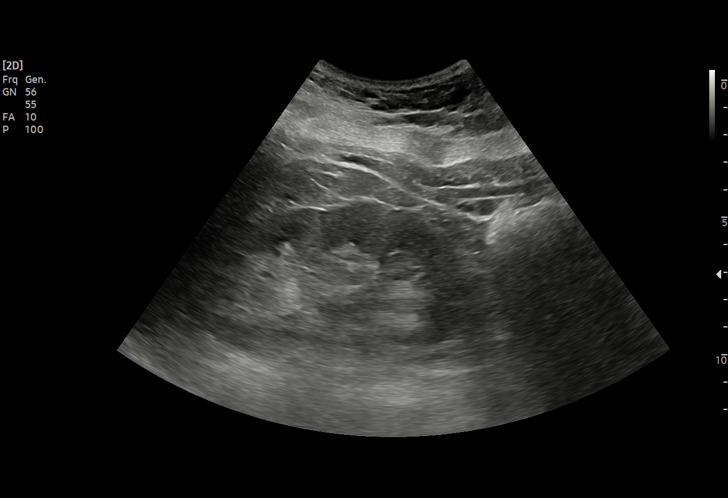
[im 98/107]
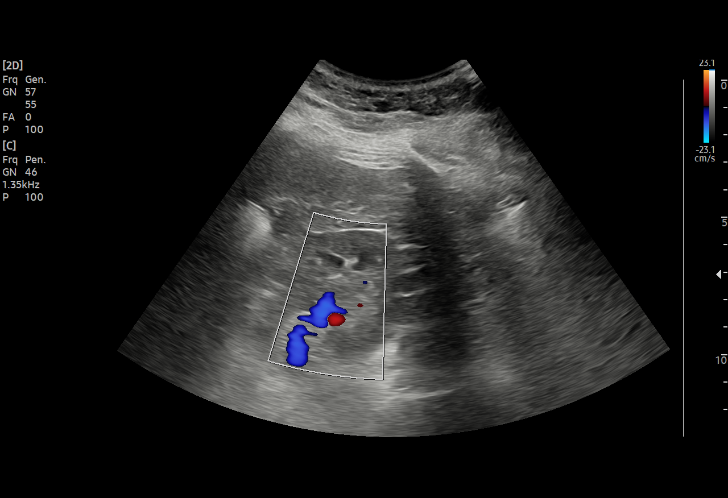
[im 107/107]
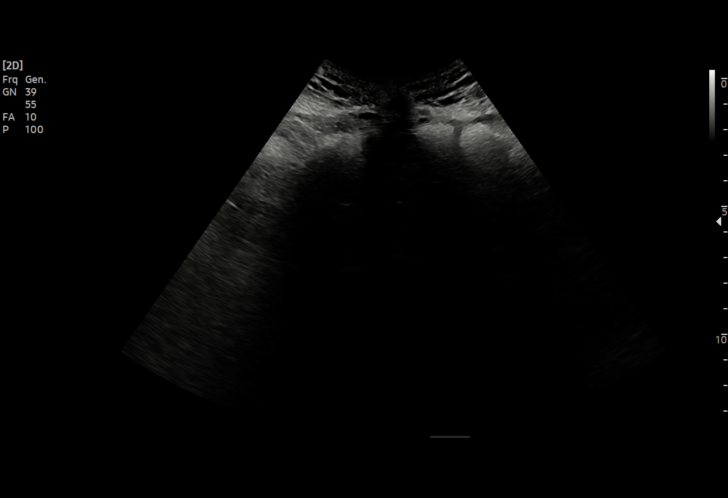

[15 of 25 positions shown; findings below may reference images not displayed]

FINDINGS: Gallbladder: Cholelithiasis with multiple stones demonstrated in the
contracted gallbladder. No gallbladder wall thickening or edema.
Murphy's sign is negative.

Common bile duct: Diameter: 4 mm, normal

Liver: Small low-attenuation area adjacent to the gallbladder fossa
likely representing focal fatty sparing. Mild diffuse increased
parenchymal echotexture suggesting diffuse fatty infiltration. No
solid lesions identified. Portal vein is patent on color Doppler
imaging with normal direction of blood flow towards the liver.

IVC: No abnormality visualized.

Pancreas: Visualized portion unremarkable.

Spleen: Size and appearance within normal limits.

Right Kidney: Length: 10.6 cm. Echogenicity within normal limits. No
mass or hydronephrosis visualized.

Left Kidney: Length: 10.3 cm. Echogenicity within normal limits. No
mass or hydronephrosis visualized.

Abdominal aorta: No aneurysm visualized.

Other findings: None.
IMPRESSION: 1. Cholelithiasis without evidence of acute cholecystitis.
2. Diffuse fatty infiltration of the liver with focal sparing.

## 2021-07-24 MED ORDER — METOPROLOL SUCCINATE ER 50 MG PO TB24
75.0000 mg | ORAL_TABLET | Freq: Once | ORAL | Status: AC
  Administered 2021-07-24: 75 mg via ORAL
  Filled 2021-07-24: qty 1

## 2021-07-24 MED ORDER — METOPROLOL SUCCINATE ER 50 MG PO TB24
100.0000 mg | ORAL_TABLET | Freq: Every day | ORAL | Status: DC
  Administered 2021-07-25: 100 mg via ORAL
  Filled 2021-07-24: qty 2

## 2021-07-24 NOTE — Plan of Care (Signed)
°  Problem: Education: Goal: Knowledge of General Education information will improve Description: Including pain rating scale, medication(s)/side effects and non-pharmacologic comfort measures Outcome: Progressing   Problem: Health Behavior/Discharge Planning: Goal: Ability to manage health-related needs will improve Outcome: Progressing   Problem: Clinical Measurements: Goal: Ability to maintain clinical measurements within normal limits will improve Outcome: Progressing Goal: Diagnostic test results will improve Outcome: Progressing Goal: Cardiovascular complication will be avoided Outcome: Progressing   Problem: Activity: Goal: Risk for activity intolerance will decrease Outcome: Progressing   Problem: Nutrition: Goal: Adequate nutrition will be maintained Outcome: Progressing   Problem: Coping: Goal: Level of anxiety will decrease Outcome: Progressing   Problem: Pain Managment: Goal: General experience of comfort will improve Outcome: Progressing   Problem: Safety: Goal: Ability to remain free from injury will improve Outcome: Progressing   Problem: Skin Integrity: Goal: Risk for impaired skin integrity will decrease Outcome: Progressing

## 2021-07-24 NOTE — Progress Notes (Signed)
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  Trileptal has black box warning for hyponatremia.  Apparently Trileptal can directly activate or increase sensitivity of the V2 (vasopressin) receptors. -STOP trileptal -Cont to recommend no more than per day fluids -Na down to 126 from 129 yesterday. Would ensure maximum 1200cc fluid restriction -Recheck bmet in AM  DM2 (diabetes mellitus, type 2) (HCC) Hold metformin Mod scale SSI AC Glycemic trends are stable  HTN (hypertension)- (present on admission) Cont amlodipine, and BB, ARB BP stable at present  Pancreatic lesion- (present on admission) -Pancreatic MRI reviewed. 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 Denies abd pain, feeling of excessive thirst. Reports good urine output  Physical Exam: Vitals:   07/23/21 2000 07/23/21 2050 07/24/21 0346 07/24/21 1355  BP:  (!) 148/63 (!) 164/62 135/66  Pulse:  68 65 60  Resp: 15 18 18 15   Temp:  98 F (36.7 C) 97.7 F (36.5 C) 97.7 F (36.5 C)  TempSrc:  Oral Oral Oral  SpO2:  96% 99% 99%  Weight:      Height:       General exam: Awake, laying in bed, in nad Respiratory system: Normal respiratory effort, no wheezing Cardiovascular system: regular rate, s1, s2 Gastrointestinal system: Soft, nondistended, positive BS Central nervous system: CN2-12 grossly intact, strength intact Extremities: Perfused, no  clubbing Skin: Normal skin turgor, no notable skin lesions seen Psychiatry: Mood normal // no visual hallucinations   Data Reviewed:  Labs reviewed  Family Communication: Pt in room, family not at bedside  Disposition: Status is: Inpatient Remains inpatient appropriate because: Severity of illness    Planned Discharge Destination: Home       Author: , MD 07/24/2021 3:07 PM  For on call review [email or web address].

## 2021-07-25 LAB — COMPREHENSIVE METABOLIC PANEL
ALT: 45 U/L — ABNORMAL HIGH (ref 0–44)
AST: 40 U/L (ref 15–41)
Albumin: 4.4 g/dL (ref 3.5–5.0)
Alkaline Phosphatase: 79 U/L (ref 38–126)
Anion gap: 8 (ref 5–15)
BUN: 23 mg/dL (ref 8–23)
CO2: 24 mmol/L (ref 22–32)
Calcium: 9.6 mg/dL (ref 8.9–10.3)
Chloride: 101 mmol/L (ref 98–111)
Creatinine, Ser: 0.76 mg/dL (ref 0.44–1.00)
GFR, Estimated: >60 mL/min (ref >60)
Glucose, Bld: 102 mg/dL — ABNORMAL HIGH (ref 70–99)
Potassium: 3.7 mmol/L (ref 3.5–5.1)
Sodium: 133 mmol/L — ABNORMAL LOW (ref 135–145)
Total Bilirubin: 0.2 mg/dL — ABNORMAL LOW (ref 0.3–1.2)
Total Protein: 7.5 g/dL (ref 6.5–8.1)

## 2021-07-25 LAB — GLUCOSE, CAPILLARY
Glucose-Capillary: 101 mg/dL — ABNORMAL HIGH (ref 70–99)
Glucose-Capillary: 106 mg/dL — ABNORMAL HIGH (ref 70–99)

## 2021-07-25 MED ORDER — GABAPENTIN 300 MG PO CAPS: 300.0000 mg | ORAL_CAPSULE | Freq: Two times a day (BID) | ORAL | 0 refills | Status: DC

## 2021-07-25 NOTE — Care Management Important Message (Signed)
Important Message  Patient Details IM Letter given to the Patient. Name: Brooke Villa MRN: [id number] Date of Birth: Apr 26, 1946   Medicare Important Message Given:  Yes     Caren Macadam 07/25/2021, 11:01 AM

## 2021-07-25 NOTE — Discharge Summary (Signed)
Physician Discharge Summary   Patient: Brooke Villa MRN: [id number] DOB: 06/07/1946  Admit date:     07/22/2021  Discharge date: 07/25/21  Discharge Physician: Rickey BarbaraStephen Sharda Keddy   PCP: Gweneth DimitriMcNeill, Wendy, MD   Recommendations at discharge:   Recommend repeat BMET in 1 week, focus on sodium Follow up with trigeminal neuralgia specialist as scheduled for 07/29/21  Discharge Diagnoses: Principal Problem:   Hyponatremia Active Problems:   Pancreatic lesion   HTN (hypertension)   DM2 (diabetes mellitus, type 2) (HCC)  Resolved Problems:   * No resolved hospital problems. *   Hospital Course: 76 y.o. female with medical history significant of HTN, HLD, DM2.   Pt being treated for trigeminal neuralgia as outpt.  Started on Trileptal on 1/23.   Sodium was normal per husband in Jan (reportedly 139 x2 weeks ago).   Presents to ED with N/V x2 days ago, ongoing nausea since then, mild.  Reduced PO food intake, tolerating liquids intermittently.  No change in bowel or bladder fxn.  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  Trileptal has black box warning for hyponatremia.  Apparently Trileptal can directly activate or increase sensitivity of the V2 (vasopressin) receptors. -STOP trileptal -Cont to recommend no more than 1200ml per day fluids -Na down to 126 from 129 yesterday. Would ensure maximum 1200cc fluid restriction -Recheck bmet in AM  DM2 (diabetes mellitus, type 2) (HCC) Hold metformin Mod scale SSI AC Glycemic trends are stable  HTN (hypertension)- (present on admission) Cont amlodipine, and BB, ARB BP stable at present  Pancreatic lesion- (present on admission) -Pancreatic MRI reviewed. Findings of a tiny 8x6x3310mm cystic lesion in the prox body of pancreas, statistically likely a small pancreatic pseudocyst or side branch  IPMN -Recommendation for f/u pre and post contrast MRI/MRCP or pancreatic protocol CT in 320yrs           Consultants:  Procedures performed:   Disposition: Home Diet recommendation:  Regular diet  DISCHARGE MEDICATION: Allergies as of 07/25/2021   No Known Allergies      Medication List     TAKE these medications    amLODipine 10 MG tablet Commonly known as: NORVASC Take 10 mg by mouth in the morning.   BIOTIN PO Take 1 tablet by mouth daily.   CALCIUM-VITAMIN D PO Take 1 tablet by mouth daily.   CINNAMON PO Take 2 tablets by mouth daily.   FLAXSEED OIL PO Take 1 capsule by mouth daily.   furosemide 20 MG tablet Commonly known as: LASIX Take 20 mg by mouth daily as needed for fluid or edema.   gabapentin 300 MG capsule Commonly known as: Neurontin Take 1 capsule (300 mg total) by mouth 2 (two) times daily for 14 days.   meloxicam 15 MG tablet Commonly known as: MOBIC Take 15 mg by mouth daily as needed for pain.   metFORMIN 500 MG (MOD) 24 hr tablet Commonly known as: GLUMETZA Take 500 mg by mouth every evening.   metoprolol succinate 100 MG 24 hr tablet Commonly known as: TOPROL-XL Take 100 mg by mouth daily.   pantoprazole 20 MG tablet Commonly known as: PROTONIX Take 20 mg by mouth daily.   rosuvastatin 20 MG tablet Commonly known as: CRESTOR Take 20 mg by mouth at bedtime.   telmisartan 80 MG tablet Commonly known as: MICARDIS Take 80 mg by mouth in the  morning.   vitamin B-12 1000 MCG tablet Commonly known as: CYANOCOBALAMIN Take 1,000 mcg by mouth 3 (three) times a week. MWF   vitamin C 500 MG tablet Commonly known as: ASCORBIC ACID Take 500 mg by mouth every evening.        Follow-up Information     Gweneth DimitriMcNeill, Wendy, MD. Schedule an appointment as soon as possible for a visit in 2 week(s).   Specialty: Family Medicine Why: Hospital follow up Contact information: [address] [place] [id number] [phone]                  Discharge Exam: Ceasar MonsFiled Weights   07/22/21 1822  Weight: 86.2 kg   General exam: Awake, laying in bed, in nad Respiratory system: Normal respiratory effort, no wheezing Cardiovascular system: regular rate, s1, s2 Gastrointestinal system: Soft, nondistended, positive BS Central nervous system: CN2-12 grossly intact, strength intact Extremities: Perfused, no clubbing Skin: Normal skin turgor, no notable skin lesions seen Psychiatry: Mood normal // no visual hallucinations   Condition at discharge: good  The results of significant diagnostics from this hospitalization (including imaging, microbiology, ancillary and laboratory) are listed below for reference.   Imaging Studies: CT Head Wo Contrast  Result Date: 07/22/2021 CLINICAL DATA:  Minor head trauma.  Dizziness. EXAM: CT HEAD WITHOUT CONTRAST TECHNIQUE: Contiguous axial images were obtained from the base of the skull through the vertex without intravenous contrast. RADIATION DOSE REDUCTION: This exam was performed according to the departmental dose-optimization program which includes automated exposure control, adjustment of the mA and/or kV according to patient size and/or use of iterative reconstruction technique. COMPARISON:  MRA 06/05/2021 FINDINGS: Brain: Partially calcified extra-axial lesion along the right frontal region measuring 2.3 cm maximal diameter. This is likely a meningioma. No significant mass effect or midline shift. No ventricular dilatation. Gray-white matter junctions are distinct. Basal cisterns are not effaced. No abnormal extra-axial fluid collections. No acute intracranial hemorrhage. Vascular: No hyperdense vessel or unexpected calcification. Skull: Calvarium appears intact. Sinuses/Orbits: Paranasal sinuses and mastoid air cells are clear. Other: None. IMPRESSION: 1. No acute intracranial abnormalities. 2. Right frontal extra-axial meningioma. Electronically Signed   By: Burman NievesWilliam  Stevens M.D.    On: 07/22/2021 21:31   US Abdomen Complete  Result Date: 07/24/2021 CLINICAL DATA:  Mid epigastric pain.  Known gallstones. EXAM: ABDOMEN ULTRASOUND COMPLETE COMPARISON:  MRI 2 11/2021, ultrasound 07/22/2021, CT 07/22/2021 FINDINGS: Gallbladder: Cholelithiasis with multiple stones demonstrated in the contracted gallbladder. No gallbladder wall thickening or edema. Murphy's sign is negative. Common bile duct: Diameter: 4 mm, normal Liver: Small low-attenuation area adjacent to the gallbladder fossa likely representing focal fatty sparing. Mild diffuse increased parenchymal echotexture suggesting diffuse fatty infiltration. No solid lesions identified. Portal vein is patent on color Doppler imaging with normal direction of blood flow towards the liver. IVC: No abnormality visualized. Pancreas: Visualized portion unremarkable. Spleen: Size and appearance within normal limits. Right Kidney: Length: 10.6 cm. Echogenicity within normal limits. No mass or hydronephrosis visualized. Left Kidney: Length: 10.3 cm. Echogenicity within normal limits. No mass or hydronephrosis visualized. Abdominal aorta: No aneurysm visualized. Other findings: None. IMPRESSION: 1. Cholelithiasis without evidence of acute cholecystitis. 2. Diffuse fatty infiltration of the liver with focal sparing. Electronically Signed   By: Burman NievesWilliam  Stevens M.D.   On: 07/24/2021 20:08   CT ABDOMEN PELVIS W CONTRAST  Addendum Date: 07/22/2021   ADDENDUM REPORT: 07/22/2021 21:52 IMPRESSION: 4. Small pancreatic hypodense lesion, indeterminate, possibly cystic. Recommend further evaluation with pancreatic MRI. Electronically  Signed   By: Darliss Cheney M.D.   On: 07/22/2021 21:52   Result Date: 07/22/2021 CLINICAL DATA:  Epigastric pain.  Dizziness. EXAM: CT ABDOMEN AND PELVIS WITH CONTRAST TECHNIQUE: Multidetector CT imaging of the abdomen and pelvis was performed using the standard protocol following bolus administration of intravenous contrast. RADIATION  DOSE REDUCTION: This exam was performed according to the departmental dose-optimization program which includes automated exposure control, adjustment of the mA and/or kV according to patient size and/or use of iterative reconstruction technique. CONTRAST:  OMNIPAQUE IOHEXOL 300 MG/ML  SOLN COMPARISON:  None. FINDINGS: Lower chest: No acute abnormality. Hepatobiliary: There may be some mild gallbladder wall thickening. No calcified gallstones or biliary ductal dilatation. There is fatty infiltration of the liver. No focal liver lesions are seen. Pancreas: There is a hypodensity in the neck of the pancreas measuring 7 x 8 mm. No pancreatic ductal dilatation or surrounding inflammation. Spleen: Normal in size without focal abnormality. Adrenals/Urinary Tract: Adrenal glands are unremarkable. Kidneys are normal, without renal calculi, focal lesion, or hydronephrosis. Bladder is unremarkable. Stomach/Bowel: Stomach is within normal limits. Appendix appears normal. No evidence of bowel wall thickening, distention, or inflammatory changes. Vascular/Lymphatic: Aortic atherosclerosis. No enlarged abdominal or pelvic lymph nodes. Reproductive: Uterus and bilateral adnexa are unremarkable. Other: No abdominal wall hernia or abnormality. No abdominopelvic ascites. Musculoskeletal: Multilevel degenerative changes affect the spine. IMPRESSION: 1. Questionable mild gallbladder wall thickening. Please correlate clinically for cholecystitis. Consider follow-up ultrasound. 2. Fatty infiltration of the liver. 3.  Aortic Atherosclerosis (ICD10-I70.0). Electronically Signed: By: Darliss Cheney M.D. On: 07/22/2021 21:29   MR 3D Recon At Scanner  Result Date: 07/23/2021 CLINICAL DATA:  76 year old female with history of pancreatic cyst. Follow-up study. EXAM: MRI ABDOMEN WITHOUT AND WITH CONTRAST (INCLUDING MRCP) TECHNIQUE: Multiplanar multisequence MR imaging of the abdomen was performed both before and after the administration  of intravenous contrast. Heavily T2-weighted images of the biliary and pancreatic ducts were obtained, and three-dimensional MRCP images were rendered by post processing. CONTRAST:  91mL GADAVIST GADOBUTROL 1 MMOL/ML IV SOLN COMPARISON:  No prior abdominal MRI. CT the abdomen and pelvis 07/22/2021. Abdominal ultrasound 07/22/2021. FINDINGS: Lower chest: Unremarkable. Hepatobiliary: There is diffuse loss of signal intensity throughout the hepatic parenchyma on out of phase dual echo images, indicative of mild hepatic steatosis. No suspicious cystic or solid hepatic lesions. No intra or extrahepatic biliary ductal dilatation is noted on MRCP images. Common bile duct measures only 3 mm in the porta hepatis. There are filling defects within the gallbladder measuring up to 1.3 cm in diameter, compatible with gallstones. Gallbladder appears completely contracted around these stones. No pericholecystic fluid or surrounding inflammatory changes. Pancreas: In the proximal pancreatic body there is an 0.8 x 0.6 x 1.0 cm T1 hypointense, T2 hyperintense, nonenhancing lesion, which does not appear to communicate with the main pancreatic duct. No other pancreatic mass. No pancreatic ductal dilatation. No peripancreatic fluid collections or inflammatory changes. Spleen:  Unremarkable. Adrenals/Urinary Tract: Bilateral kidneys and adrenal glands are normal in appearance. No hydroureteronephrosis in the visualized portions of the abdomen. Stomach/Bowel: Visualized portions are unremarkable. Vascular/Lymphatic: No aneurysm identified in the visualized abdominal vasculature. No lymphadenopathy noted in the abdomen. Other: No significant volume of ascites noted in the visualized portions of the peritoneal cavity. Musculoskeletal: No aggressive appearing osseous lesions are noted in the visualized portions of the skeleton. IMPRESSION: 1. Study is positive for cholelithiasis, but there are no findings to suggest acute cholecystitis. 2.  Study is negative for  choledocholithiasis or evidence of biliary tract obstruction. 3. Mild hepatic steatosis. 4. 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 Recommend follow up pre and post contrast MRI/MRCP or pancreatic protocol CT in 2 years. This recommendation follows ACR consensus guidelines: Management of Incidental Pancreatic Cysts: A White Paper of the ACR Incidental Findings Committee. J Am Coll Radiol 2017;14:911-923. Electronically Signed   By: Trudie Reed M.D.   On: 07/23/2021 04:58   DG Chest Portable 1 View  Result Date: 07/22/2021 CLINICAL DATA:  Presyncope, dizziness EXAM: PORTABLE CHEST 1 VIEW COMPARISON:  06/17/2016 FINDINGS: The heart size and mediastinal contours are within normal limits. Both lungs are clear. The visualized skeletal structures are unremarkable. IMPRESSION: No active disease. Electronically Signed   By: Helyn Numbers M.D.   On: 07/22/2021 19:56   MR ABDOMEN MRCP W WO CONTAST  Result Date: 07/23/2021 CLINICAL DATA:  76 year old female with history of pancreatic cyst. Follow-up study. EXAM: MRI ABDOMEN WITHOUT AND WITH CONTRAST (INCLUDING MRCP) TECHNIQUE: Multiplanar multisequence MR imaging of the abdomen was performed both before and after the administration of intravenous contrast. Heavily T2-weighted images of the biliary and pancreatic ducts were obtained, and three-dimensional MRCP images were rendered by post processing. CONTRAST:  49mL GADAVIST GADOBUTROL 1 MMOL/ML IV SOLN COMPARISON:  No prior abdominal MRI. CT the abdomen and pelvis 07/22/2021. Abdominal ultrasound 07/22/2021. FINDINGS: Lower chest: Unremarkable. Hepatobiliary: There is diffuse loss of signal intensity throughout the hepatic parenchyma on out of phase dual echo images, indicative of mild hepatic steatosis. No suspicious cystic or solid hepatic lesions.  No intra or extrahepatic biliary ductal dilatation is noted on MRCP images. Common bile duct measures only 3 mm in the porta hepatis. There are filling defects within the gallbladder measuring up to 1.3 cm in diameter, compatible with gallstones. Gallbladder appears completely contracted around these stones. No pericholecystic fluid or surrounding inflammatory changes. Pancreas: In the proximal pancreatic body there is an 0.8 x 0.6 x 1.0 cm T1 hypointense, T2 hyperintense, nonenhancing lesion, which does not appear to communicate with the main pancreatic duct. No other pancreatic mass. No pancreatic ductal dilatation. No peripancreatic fluid collections or inflammatory changes. Spleen:  Unremarkable. Adrenals/Urinary Tract: Bilateral kidneys and adrenal glands are normal in appearance. No hydroureteronephrosis in the visualized portions of the abdomen. Stomach/Bowel: Visualized portions are unremarkable. Vascular/Lymphatic: No aneurysm identified in the visualized abdominal vasculature. No lymphadenopathy noted in the abdomen. Other: No significant volume of ascites noted in the visualized portions of the peritoneal cavity. Musculoskeletal: No aggressive appearing osseous lesions are noted in the visualized portions of the skeleton. IMPRESSION: 1. Study is positive for cholelithiasis, but there are no findings to suggest acute cholecystitis. 2. Study is negative for choledocholithiasis or evidence of biliary tract obstruction. 3. Mild hepatic steatosis. 4. 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 Recommend follow up pre and post contrast MRI/MRCP or pancreatic protocol CT in 2 years. This recommendation follows ACR consensus guidelines: Management of Incidental Pancreatic Cysts: A White Paper of the ACR Incidental Findings Committee. J Am Coll Radiol 2017;14:911-923.  Electronically Signed   By: Trudie Reed M.D.   On: 07/23/2021 04:58   US Abdomen Limited RUQ (LIVER/GB)  Result Date: 07/22/2021 CLINICAL DATA:  Right upper quadrant  pain. EXAM: ULTRASOUND ABDOMEN LIMITED RIGHT UPPER QUADRANT COMPARISON:  CT abdomen and pelvis 07/22/2021. FINDINGS: Gallbladder: Numerous gallstones are present. Largest calculus measures 1.4 cm. There is no gallbladder wall thickening. No sonographic Murphy sign noted by sonographer. Common bile duct: Diameter: 4.6 mm. Liver: No focal lesion identified. Increase in parenchymal echogenicity. Portal vein is patent on color Doppler imaging with normal direction of blood flow towards the liver. Other: None. IMPRESSION: 1. Cholelithiasis. No additional sonographic evidence for acute cholecystitis. 2.  Echogenic liver likely related to fatty infiltration. Electronically Signed   By: Darliss Cheney M.D.   On: 07/22/2021 22:38    Microbiology: Results for orders placed or performed during the hospital encounter of 07/22/21  Resp Panel by RT-PCR (Flu A&B, Covid) Nasopharyngeal Swab     Status: None   Collection Time: 07/22/21  7:55 PM   Specimen: Nasopharyngeal Swab; Nasopharyngeal(NP) swabs in vial transport medium  Result Value Ref Range Status   SARS Coronavirus 2 by RT PCR NEGATIVE NEGATIVE Final    Comment: (NOTE) SARS-CoV-2 target nucleic acids are NOT DETECTED.  The SARS-CoV-2 RNA is generally detectable in upper respiratory specimens during the acute phase of infection. The lowest concentration of SARS-CoV-2 viral copies this assay can detect is 138 copies/mL. A negative result does not preclude SARS-Cov-2 infection and should not be used as the sole basis for treatment or other patient management decisions. A negative result may occur with  improper specimen collection/handling, submission of specimen other than nasopharyngeal swab, presence of viral mutation(s) within the areas targeted by this assay, and inadequate number  of viral copies(<138 copies/mL). A negative result must be combined with clinical observations, patient history, and epidemiological information. The expected result is Negative.  Fact Sheet for Patients:  BloggerCourse.com  Fact Sheet for Healthcare Providers:  SeriousBroker.it  This test is no t yet approved or cleared by the [place] FDA and  has been authorized for detection and/or diagnosis of SARS-CoV-2 by FDA under an Emergency Use Authorization (EUA). This EUA will remain  in effect (meaning this test can be used) for the duration of the COVID-19 declaration under Section 564(b)(1) of the Act, 21 U.S.C.section 360bbb-3(b)(1), unless the authorization is terminated  or revoked sooner.       Influenza A by PCR NEGATIVE NEGATIVE Final   Influenza B by PCR NEGATIVE NEGATIVE Final    Comment: (NOTE) The Xpert Xpress SARS-CoV-2/FLU/RSV plus assay is intended as an aid in the diagnosis of influenza from Nasopharyngeal swab specimens and should not be used as a sole basis for treatment. Nasal washings and aspirates are unacceptable for Xpert Xpress SARS-CoV-2/FLU/RSV testing.  Fact Sheet for Patients: BloggerCourse.com  Fact Sheet for Healthcare Providers: SeriousBroker.it  This test is not yet approved or cleared by the [place] FDA and has been authorized for detection and/or diagnosis of SARS-CoV-2 by FDA under an Emergency Use Authorization (EUA). This EUA will remain in effect (meaning this test can be used) for the duration of the COVID-19 declaration under Section 564(b)(1) of the Act, 21 U.S.C. section 360bbb-3(b)(1), unless the authorization is terminated or revoked.  Performed at [facility], [address] [place], [place] [address]     Labs: CBC: Recent Labs  Lab 07/22/21 1824  WBC 5.5  NEUTROABS 3.4  HGB 13.4  HCT 39.9   MCV 73.9*  PLT 231   Basic Metabolic Panel: Recent Labs  Lab 07/23/21 0628 07/23/21 1423 07/23/21 1436 07/24/21 0517 07/25/21 0440  NA 128*  125* 129* 126* 133*  K 4.4 4.2 4.2 4.9 3.7  CL 93* 94* 96* 98 101  CO2 24 23 24  18* 24  GLUCOSE 97 114* 112* 100* 102*  BUN 15 15 16 19 23   CREATININE 0.85 1.01* 1.10* 0.90 0.76  CALCIUM 9.5 9.0 9.3 9.3 9.6   Liver Function Tests: Recent Labs  Lab 07/22/21 1824 07/25/21 0440  AST 53* 40  ALT 53* 45*  ALKPHOS 92 79  BILITOT 0.5 0.2*  PROT 8.2* 7.5  ALBUMIN 4.8 4.4   CBG: Recent Labs  Lab 07/24/21 0806 07/24/21 1250 07/24/21 1653 07/24/21 2042 07/25/21 0749  GLUCAP 101* 110* 113* 89 101*    Discharge time spent: less than 30 minutes.  Signed: 2043, MD [facility] 07/25/2021

## 2021-07-25 NOTE — Clinical Social Work Note (Signed)
°  Transition of Care [facility]) Screening Note   Patient Details  Name: Brooke Villa Date of Birth: Mar 10, 1946   Transition of Care [facility]) CM/SW Contact:    Ida Rogue, LCSW Phone Number: 07/25/2021, 8:57 AM    Transition of Care Department [facility]) has reviewed patient and no TOC needs have been identified at this time. We will continue to monitor patient advancement through interdisciplinary progression rounds. If new patient transition needs arise, please place a TOC consult.

## 2021-07-30 DIAGNOSIS — E1129 Type 2 diabetes mellitus with other diabetic kidney complication: Secondary | ICD-10-CM | POA: Diagnosis not present

## 2021-07-30 DIAGNOSIS — R809 Proteinuria, unspecified: Secondary | ICD-10-CM | POA: Diagnosis not present

## 2021-07-30 DIAGNOSIS — E1165 Type 2 diabetes mellitus with hyperglycemia: Secondary | ICD-10-CM | POA: Diagnosis not present

## 2021-07-30 DIAGNOSIS — E871 Hypo-osmolality and hyponatremia: Secondary | ICD-10-CM | POA: Diagnosis not present

## 2021-08-02 DIAGNOSIS — E1129 Type 2 diabetes mellitus with other diabetic kidney complication: Secondary | ICD-10-CM | POA: Diagnosis not present

## 2021-08-02 DIAGNOSIS — R059 Cough, unspecified: Secondary | ICD-10-CM | POA: Diagnosis not present

## 2021-08-02 DIAGNOSIS — R801 Persistent proteinuria, unspecified: Secondary | ICD-10-CM | POA: Diagnosis not present

## 2021-08-02 DIAGNOSIS — Z7984 Long term (current) use of oral hypoglycemic drugs: Secondary | ICD-10-CM | POA: Diagnosis not present

## 2021-08-02 DIAGNOSIS — R609 Edema, unspecified: Secondary | ICD-10-CM | POA: Diagnosis not present

## 2021-08-02 DIAGNOSIS — I10 Essential (primary) hypertension: Secondary | ICD-10-CM | POA: Diagnosis not present

## 2021-08-02 DIAGNOSIS — M5451 Vertebrogenic low back pain: Secondary | ICD-10-CM | POA: Diagnosis not present

## 2021-08-02 DIAGNOSIS — N1832 Chronic kidney disease, stage 3b: Secondary | ICD-10-CM | POA: Diagnosis not present

## 2021-08-02 DIAGNOSIS — E782 Mixed hyperlipidemia: Secondary | ICD-10-CM | POA: Diagnosis not present

## 2021-08-02 DIAGNOSIS — K219 Gastro-esophageal reflux disease without esophagitis: Secondary | ICD-10-CM | POA: Diagnosis not present

## 2021-12-09 DIAGNOSIS — Z961 Presence of intraocular lens: Secondary | ICD-10-CM | POA: Diagnosis not present

## 2021-12-09 DIAGNOSIS — H35372 Puckering of macula, left eye: Secondary | ICD-10-CM | POA: Diagnosis not present

## 2021-12-09 DIAGNOSIS — H16143 Punctate keratitis, bilateral: Secondary | ICD-10-CM | POA: Diagnosis not present

## 2022-01-06 ENCOUNTER — Other Ambulatory Visit: Payer: Self-pay | Admitting: Family Medicine

## 2022-01-06 DIAGNOSIS — Z1231 Encounter for screening mammogram for malignant neoplasm of breast: Secondary | ICD-10-CM

## 2022-01-29 DIAGNOSIS — I10 Essential (primary) hypertension: Secondary | ICD-10-CM | POA: Diagnosis not present

## 2022-01-29 DIAGNOSIS — E782 Mixed hyperlipidemia: Secondary | ICD-10-CM | POA: Diagnosis not present

## 2022-01-29 DIAGNOSIS — M5451 Vertebrogenic low back pain: Secondary | ICD-10-CM | POA: Diagnosis not present

## 2022-01-29 DIAGNOSIS — R300 Dysuria: Secondary | ICD-10-CM | POA: Diagnosis not present

## 2022-01-29 DIAGNOSIS — R609 Edema, unspecified: Secondary | ICD-10-CM | POA: Diagnosis not present

## 2022-01-29 DIAGNOSIS — E1129 Type 2 diabetes mellitus with other diabetic kidney complication: Secondary | ICD-10-CM | POA: Diagnosis not present

## 2022-01-29 DIAGNOSIS — G500 Trigeminal neuralgia: Secondary | ICD-10-CM | POA: Diagnosis not present

## 2022-01-29 DIAGNOSIS — Z23 Encounter for immunization: Secondary | ICD-10-CM | POA: Diagnosis not present

## 2022-01-29 DIAGNOSIS — R801 Persistent proteinuria, unspecified: Secondary | ICD-10-CM | POA: Diagnosis not present

## 2022-01-30 DIAGNOSIS — Z0000 Encounter for general adult medical examination without abnormal findings: Secondary | ICD-10-CM | POA: Diagnosis not present

## 2022-02-20 ENCOUNTER — Ambulatory Visit
Admission: RE | Admit: 2022-02-20 | Discharge: 2022-02-20 | Disposition: A | Discharge status: Home / Self Care | Payer: Medicare Other | Source: Ambulatory Visit | Attending: Family Medicine | Admitting: Family Medicine

## 2022-02-20 DIAGNOSIS — Z1231 Encounter for screening mammogram for malignant neoplasm of breast: Secondary | ICD-10-CM | POA: Diagnosis not present

## 2022-03-05 DIAGNOSIS — H02831 Dermatochalasis of right upper eyelid: Secondary | ICD-10-CM | POA: Diagnosis not present

## 2022-03-05 DIAGNOSIS — H35372 Puckering of macula, left eye: Secondary | ICD-10-CM | POA: Diagnosis not present

## 2022-03-05 DIAGNOSIS — H16143 Punctate keratitis, bilateral: Secondary | ICD-10-CM | POA: Diagnosis not present

## 2022-03-05 DIAGNOSIS — H02834 Dermatochalasis of left upper eyelid: Secondary | ICD-10-CM | POA: Diagnosis not present

## 2022-03-05 DIAGNOSIS — Z961 Presence of intraocular lens: Secondary | ICD-10-CM | POA: Diagnosis not present

## 2022-08-04 DIAGNOSIS — E1129 Type 2 diabetes mellitus with other diabetic kidney complication: Secondary | ICD-10-CM | POA: Diagnosis not present

## 2022-08-04 DIAGNOSIS — R609 Edema, unspecified: Secondary | ICD-10-CM | POA: Diagnosis not present

## 2022-08-04 DIAGNOSIS — G500 Trigeminal neuralgia: Secondary | ICD-10-CM | POA: Diagnosis not present

## 2022-08-04 DIAGNOSIS — R801 Persistent proteinuria, unspecified: Secondary | ICD-10-CM | POA: Diagnosis not present

## 2022-08-04 DIAGNOSIS — M5432 Sciatica, left side: Secondary | ICD-10-CM | POA: Diagnosis not present

## 2022-08-04 DIAGNOSIS — I10 Essential (primary) hypertension: Secondary | ICD-10-CM | POA: Diagnosis not present

## 2022-08-04 DIAGNOSIS — D320 Benign neoplasm of cerebral meninges: Secondary | ICD-10-CM | POA: Diagnosis not present

## 2022-08-04 DIAGNOSIS — M5431 Sciatica, right side: Secondary | ICD-10-CM | POA: Diagnosis not present

## 2022-08-04 DIAGNOSIS — E1122 Type 2 diabetes mellitus with diabetic chronic kidney disease: Secondary | ICD-10-CM | POA: Diagnosis not present

## 2022-08-04 DIAGNOSIS — N1832 Chronic kidney disease, stage 3b: Secondary | ICD-10-CM | POA: Diagnosis not present

## 2022-08-04 DIAGNOSIS — E782 Mixed hyperlipidemia: Secondary | ICD-10-CM | POA: Diagnosis not present

## 2023-02-02 ENCOUNTER — Other Ambulatory Visit: Payer: Self-pay | Admitting: Family Medicine

## 2023-02-02 DIAGNOSIS — Z1231 Encounter for screening mammogram for malignant neoplasm of breast: Secondary | ICD-10-CM

## 2023-02-03 DIAGNOSIS — E782 Mixed hyperlipidemia: Secondary | ICD-10-CM | POA: Diagnosis not present

## 2023-02-03 DIAGNOSIS — R801 Persistent proteinuria, unspecified: Secondary | ICD-10-CM | POA: Diagnosis not present

## 2023-02-03 DIAGNOSIS — Z0000 Encounter for general adult medical examination without abnormal findings: Secondary | ICD-10-CM | POA: Diagnosis not present

## 2023-02-03 DIAGNOSIS — E1129 Type 2 diabetes mellitus with other diabetic kidney complication: Secondary | ICD-10-CM | POA: Diagnosis not present

## 2023-02-04 DIAGNOSIS — M5451 Vertebrogenic low back pain: Secondary | ICD-10-CM | POA: Diagnosis not present

## 2023-02-04 DIAGNOSIS — R801 Persistent proteinuria, unspecified: Secondary | ICD-10-CM | POA: Diagnosis not present

## 2023-02-04 DIAGNOSIS — R609 Edema, unspecified: Secondary | ICD-10-CM | POA: Diagnosis not present

## 2023-02-04 DIAGNOSIS — E782 Mixed hyperlipidemia: Secondary | ICD-10-CM | POA: Diagnosis not present

## 2023-02-04 DIAGNOSIS — D320 Benign neoplasm of cerebral meninges: Secondary | ICD-10-CM | POA: Diagnosis not present

## 2023-02-04 DIAGNOSIS — E119 Type 2 diabetes mellitus without complications: Secondary | ICD-10-CM | POA: Diagnosis not present

## 2023-02-04 DIAGNOSIS — M19049 Primary osteoarthritis, unspecified hand: Secondary | ICD-10-CM | POA: Diagnosis not present

## 2023-02-04 DIAGNOSIS — M778 Other enthesopathies, not elsewhere classified: Secondary | ICD-10-CM | POA: Diagnosis not present

## 2023-02-04 DIAGNOSIS — Z9989 Dependence on other enabling machines and devices: Secondary | ICD-10-CM | POA: Diagnosis not present

## 2023-02-04 DIAGNOSIS — G500 Trigeminal neuralgia: Secondary | ICD-10-CM | POA: Diagnosis not present

## 2023-02-24 ENCOUNTER — Ambulatory Visit
Admission: RE | Admit: 2023-02-24 | Discharge: 2023-02-24 | Disposition: A | Discharge status: Home / Self Care | Payer: Medicare Other | Source: Ambulatory Visit | Attending: Family Medicine | Admitting: Family Medicine

## 2023-02-24 DIAGNOSIS — M545 Low back pain, unspecified: Secondary | ICD-10-CM | POA: Diagnosis not present

## 2023-02-24 DIAGNOSIS — M4805 Spinal stenosis, thoracolumbar region: Secondary | ICD-10-CM | POA: Diagnosis not present

## 2023-02-24 DIAGNOSIS — Z1231 Encounter for screening mammogram for malignant neoplasm of breast: Secondary | ICD-10-CM

## 2023-03-03 DIAGNOSIS — M4805 Spinal stenosis, thoracolumbar region: Secondary | ICD-10-CM | POA: Diagnosis not present

## 2023-03-03 DIAGNOSIS — M545 Low back pain, unspecified: Secondary | ICD-10-CM | POA: Diagnosis not present

## 2023-03-10 DIAGNOSIS — M545 Low back pain, unspecified: Secondary | ICD-10-CM | POA: Diagnosis not present

## 2023-03-10 DIAGNOSIS — M4805 Spinal stenosis, thoracolumbar region: Secondary | ICD-10-CM | POA: Diagnosis not present

## 2023-03-11 DIAGNOSIS — Z961 Presence of intraocular lens: Secondary | ICD-10-CM | POA: Diagnosis not present

## 2023-03-11 DIAGNOSIS — H35372 Puckering of macula, left eye: Secondary | ICD-10-CM | POA: Diagnosis not present

## 2023-03-11 DIAGNOSIS — E119 Type 2 diabetes mellitus without complications: Secondary | ICD-10-CM | POA: Diagnosis not present

## 2023-03-17 DIAGNOSIS — M545 Low back pain, unspecified: Secondary | ICD-10-CM | POA: Diagnosis not present

## 2023-03-17 DIAGNOSIS — M4805 Spinal stenosis, thoracolumbar region: Secondary | ICD-10-CM | POA: Diagnosis not present

## 2023-03-24 DIAGNOSIS — M4805 Spinal stenosis, thoracolumbar region: Secondary | ICD-10-CM | POA: Diagnosis not present

## 2023-03-24 DIAGNOSIS — M545 Low back pain, unspecified: Secondary | ICD-10-CM | POA: Diagnosis not present

## 2023-03-31 DIAGNOSIS — M545 Low back pain, unspecified: Secondary | ICD-10-CM | POA: Diagnosis not present

## 2023-04-07 DIAGNOSIS — M4805 Spinal stenosis, thoracolumbar region: Secondary | ICD-10-CM | POA: Diagnosis not present

## 2023-04-07 DIAGNOSIS — M545 Low back pain, unspecified: Secondary | ICD-10-CM | POA: Diagnosis not present

## 2023-04-14 DIAGNOSIS — M4805 Spinal stenosis, thoracolumbar region: Secondary | ICD-10-CM | POA: Diagnosis not present

## 2023-04-14 DIAGNOSIS — M545 Low back pain, unspecified: Secondary | ICD-10-CM | POA: Diagnosis not present

## 2023-08-11 DIAGNOSIS — E1129 Type 2 diabetes mellitus with other diabetic kidney complication: Secondary | ICD-10-CM | POA: Diagnosis not present

## 2023-08-11 DIAGNOSIS — R801 Persistent proteinuria, unspecified: Secondary | ICD-10-CM | POA: Diagnosis not present

## 2023-08-11 DIAGNOSIS — E782 Mixed hyperlipidemia: Secondary | ICD-10-CM | POA: Diagnosis not present

## 2023-08-13 DIAGNOSIS — G8929 Other chronic pain: Secondary | ICD-10-CM | POA: Diagnosis not present

## 2023-08-13 DIAGNOSIS — K219 Gastro-esophageal reflux disease without esophagitis: Secondary | ICD-10-CM | POA: Diagnosis not present

## 2023-08-13 DIAGNOSIS — M5441 Lumbago with sciatica, right side: Secondary | ICD-10-CM | POA: Diagnosis not present

## 2023-08-13 DIAGNOSIS — E782 Mixed hyperlipidemia: Secondary | ICD-10-CM | POA: Diagnosis not present

## 2023-08-13 DIAGNOSIS — N1832 Chronic kidney disease, stage 3b: Secondary | ICD-10-CM | POA: Diagnosis not present

## 2023-08-13 DIAGNOSIS — G500 Trigeminal neuralgia: Secondary | ICD-10-CM | POA: Diagnosis not present

## 2023-08-13 DIAGNOSIS — E1129 Type 2 diabetes mellitus with other diabetic kidney complication: Secondary | ICD-10-CM | POA: Diagnosis not present

## 2023-08-13 DIAGNOSIS — M5442 Lumbago with sciatica, left side: Secondary | ICD-10-CM | POA: Diagnosis not present

## 2023-08-13 DIAGNOSIS — I10 Essential (primary) hypertension: Secondary | ICD-10-CM | POA: Diagnosis not present

## 2023-08-17 ENCOUNTER — Other Ambulatory Visit: Payer: Self-pay | Admitting: Family Medicine

## 2023-08-17 DIAGNOSIS — M5442 Lumbago with sciatica, left side: Secondary | ICD-10-CM

## 2023-09-01 ENCOUNTER — Ambulatory Visit
Admission: RE | Admit: 2023-09-01 | Discharge: 2023-09-01 | Disposition: A | Discharge status: Home / Self Care | Source: Ambulatory Visit | Attending: Family Medicine | Admitting: Family Medicine

## 2023-09-01 DIAGNOSIS — M5126 Other intervertebral disc displacement, lumbar region: Secondary | ICD-10-CM | POA: Diagnosis not present

## 2023-09-01 DIAGNOSIS — M48061 Spinal stenosis, lumbar region without neurogenic claudication: Secondary | ICD-10-CM | POA: Diagnosis not present

## 2023-09-01 DIAGNOSIS — M47816 Spondylosis without myelopathy or radiculopathy, lumbar region: Secondary | ICD-10-CM | POA: Diagnosis not present

## 2023-09-01 DIAGNOSIS — M5442 Lumbago with sciatica, left side: Secondary | ICD-10-CM

## 2023-10-13 DIAGNOSIS — M48062 Spinal stenosis, lumbar region with neurogenic claudication: Secondary | ICD-10-CM | POA: Diagnosis not present

## 2023-10-13 DIAGNOSIS — M4316 Spondylolisthesis, lumbar region: Secondary | ICD-10-CM | POA: Diagnosis not present

## 2023-11-06 DIAGNOSIS — R801 Persistent proteinuria, unspecified: Secondary | ICD-10-CM | POA: Diagnosis not present

## 2023-11-06 DIAGNOSIS — N1832 Chronic kidney disease, stage 3b: Secondary | ICD-10-CM | POA: Diagnosis not present

## 2023-11-13 DIAGNOSIS — M533 Sacrococcygeal disorders, not elsewhere classified: Secondary | ICD-10-CM | POA: Diagnosis not present

## 2023-11-26 ENCOUNTER — Ambulatory Visit: Admitting: Orthopedic Surgery

## 2023-11-26 ENCOUNTER — Other Ambulatory Visit (INDEPENDENT_AMBULATORY_CARE_PROVIDER_SITE_OTHER): Payer: Self-pay

## 2023-11-26 VITALS — BP 126/73 | HR 65 | Ht 63.5 in | Wt 201.0 lb

## 2023-11-26 DIAGNOSIS — M545 Low back pain, unspecified: Secondary | ICD-10-CM

## 2023-11-26 DIAGNOSIS — M5416 Radiculopathy, lumbar region: Secondary | ICD-10-CM

## 2023-11-26 NOTE — Progress Notes (Signed)
 Orthopedic Spine Surgery Office Note  Assessment: Patient is a 78 y.o. female with low back pain that radiates into bilateral lower extremities.  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  There is a spondylolisthesis and PI-LL mismatch as well   Plan: -Explained that initially conservative treatment is tried as a significant number of patients may experience relief with these treatment modalities. Discussed that the conservative treatments include:  -activity modification  -physical therapy  -over the counter pain medications  -medrol  dosepak  -lumbar steroid injections -Patient has tried PT, Tylenol , meloxicam , gabapentin , acupuncture -Recommended diagnostic/therapeutic lumbar injection.  Referral provided to her today -If the injection does not work, discuss L4/5 lateral interbody fusion and PSIF to correct her PI-LL mismatch and indirectly decompress her spine -Patient should return to office in 6 weeks, x-rays at next visit: none   Patient expressed understanding of the plan and all questions were answered to the patient's satisfaction.   ___________________________________________________________________________   History:  Patient is a 78 y.o. female who presents today for lumbar spine.  Patient has had 2 years of low back pain that radiates into her bilateral lower extremities.  For the most part, she feels it along the lateral aspect of the hips and in the buttocks.  However it will also radiate down the lateral aspect of her leg to the level of the ankles.  She notices the pain is worse if she is walking or standing.  It does get better but it does not completely go away if she sits down.  She has tried multiple conservative treatments but has not noticed any significant or lasting relief with months of event tried so far.  There is no trauma or injury that preceded the onset of her pain. Pain has been getting progressively worse with time.    Weakness: Denies Symptoms  of imbalance: Yes, chronically feels off balance and uses a cane.  No recent changes in her balance or unsteadiness Paresthesias and numbness: Denies Bowel or bladder incontinence: Has stress incontinence.  No recent changes.  No other changes in her bowel or bladder habits Saddle anesthesia: Denies  Treatments tried: PT, Tylenol , meloxicam , gabapentin , acupuncture  Review of systems: Denies fevers and chills, night sweats, unexplained weight loss, history of cancer.  Has had pain that wakes her at night  Past medical history: DM GERD HLD HTN  Allergies: NKDA  Past surgical history:  C&C of uterus Left partial knee replacement Tubal ligation Cataract surgery  Social history: Denies use of nicotine product (smoking, vaping, patches, smokeless) Alcohol use: denies Denies recreational drug use   Physical Exam:  BMI of 35.1  General: no acute distress, appears stated age Neurologic: alert, answering questions appropriately, following commands Respiratory: unlabored breathing on room air, symmetric chest rise Psychiatric: appropriate affect, normal cadence to speech   MSK (spine):  -Strength exam      Left  Right EHL    5/5  5/5 TA    5/5  5/5 GSC    5/5  5/5 Knee extension  5/5  5/5 Hip flexion   5/5  5/5  -Sensory exam    Sensation intact to light touch in L3-S1 nerve distributions of bilateral lower extremities  -Achilles DTR: 1/4 on the left, 1/4 on the right -Patellar tendon DTR: 1/4 on the left, 1/4 on the right  -Straight leg raise: negative bilaterally -Clonus: no beats bilaterally  -Left hip exam: no pain through range of motion -Right hip exam: no pain through range of  motion  Imaging: XRs of the lumbar spine from 11/26/2023 were independently reviewed and interpreted, showing disc height loss at L3/4 and L4/5. Spondylolisthesis seen at L4/5. No fracture or dislocation seen. PI of 51, LL of 26.  MRI of the lumbar spine from 09/01/2023 was  independently reviewed and interpreted, showing DDD at L4/5.  Spondylolisthesis at L4/5.  Central, lateral recess, and right-sided foraminal stenosis at L4/5.  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  DEXA scan from 02/12/2021 showed a T-score of 0.6.   Patient name: Brooke Villa Patient MRN: [id number] Date of visit: 11/26/23

## 2023-12-29 ENCOUNTER — Other Ambulatory Visit: Payer: Self-pay

## 2023-12-29 ENCOUNTER — Ambulatory Visit (INDEPENDENT_AMBULATORY_CARE_PROVIDER_SITE_OTHER): Admitting: Physical Medicine and Rehabilitation

## 2023-12-29 VITALS — BP 130/70 | HR 62

## 2023-12-29 DIAGNOSIS — M5416 Radiculopathy, lumbar region: Secondary | ICD-10-CM

## 2023-12-29 MED ORDER — METHYLPREDNISOLONE ACETATE 40 MG/ML IJ SUSP
40.0000 mg | Freq: Once | INTRAMUSCULAR | Status: AC
  Administered 2023-12-29: 40 mg

## 2023-12-29 NOTE — Progress Notes (Signed)
 Pain Scale   Average Pain 3 Patient advising she has Lower back pain without relief and patient states she is in constant pain .        +Driver, -BT, -Dye Allergies.

## 2023-12-29 NOTE — Procedures (Signed)
 Lumbar Epidural Steroid Injection - Interlaminar Approach with Fluoroscopic Guidance  Patient: Brooke Villa      Date of Birth: [birth year] MRN: [id number] PCP: Aisha Harvey, MD      Visit Date: 12/29/2023   Universal Protocol:     Consent Given By: the patient  Position: PRONE  Additional Comments: Vital signs were monitored before and after the procedure. Patient was prepped and draped in the usual sterile fashion. The correct patient, procedure, and site was verified.   Injection Procedure Details:   Procedure diagnoses: Lumbar radiculopathy [M54.16]   Meds Administered:  Meds ordered this encounter  Medications   methylPREDNISolone  acetate (DEPO-MEDROL ) injection 40 mg     Laterality: Midline  Location/Site:  L4-5, L5 sacralized   Needle: 3.5 in., 20 ga. Tuohy  Needle Placement: Paramedian epidural  Findings:   -Comments: Excellent flow of contrast into the epidural space.  Procedure Details: Using a paramedian approach from the side mentioned above, the region overlying the inferior lamina was localized under fluoroscopic visualization and the soft tissues overlying this structure were infiltrated with 4 ml. of 1% Lidocaine  without Epinephrine . The Tuohy needle was inserted into the epidural space using a paramedian approach.   The epidural space was localized using loss of resistance along with counter oblique bi-planar fluoroscopic views.  After negative aspirate for air, blood, and CSF, a 2 ml. volume of Isovue-250 was injected into the epidural space and the flow of contrast was observed. Radiographs were obtained for documentation purposes.    The injectate was administered into the level noted above.   Additional Comments:  The patient tolerated the procedure well Dressing: 2 x 2 sterile gauze and Band-Aid    Post-procedure details: Patient was observed during the procedure. Post-procedure instructions were reviewed.  Patient left the clinic in  stable condition.

## 2023-12-29 NOTE — Patient Instructions (Signed)

## 2023-12-29 NOTE — Progress Notes (Signed)
 Brooke Villa - 78 y.o. female MRN [id number]  Date of birth: Mar 29, 1946  Office Visit Note: Visit Date: 12/29/2023 PCP: Aisha Harvey, MD Referred by: Georgina Ozell LABOR, MD  Subjective: Chief Complaint  Patient presents with   Lower Back - Pain   HPI:  Brooke Villa is a 78 y.o. female who comes in today at the request of Dr. Ozell Georgina for planned Midline L4-5 Lumbar Interlaminar epidural steroid injection with fluoroscopic guidance.  The patient has failed conservative care including home exercise, medications, time and activity modification.  This injection will be diagnostic and hopefully therapeutic.  Please see requesting physician notes for further details and justification.    ROS Otherwise per HPI.  Assessment & Plan: Visit Diagnoses:    ICD-10-CM   1. Lumbar radiculopathy  M54.16 XR C-ARM NO REPORT    Epidural Steroid injection    methylPREDNISolone  acetate (DEPO-MEDROL ) injection 40 mg      Plan: No additional findings.   Meds & Orders:  Meds ordered this encounter  Medications   methylPREDNISolone  acetate (DEPO-MEDROL ) injection 40 mg    Orders Placed This Encounter  Procedures   XR C-ARM NO REPORT   Epidural Steroid injection    Follow-up: Return for visit to requesting provider as needed.   Procedures: No procedures performed  Lumbar Epidural Steroid Injection - Interlaminar Approach with Fluoroscopic Guidance  Patient: Brooke Villa      Date of Birth: 1946-01-28 MRN: [id number] PCP: Aisha Harvey, MD      Visit Date: 12/29/2023   Universal Protocol:     Consent Given By: the patient  Position: PRONE  Additional Comments: Vital signs were monitored before and after the procedure. Patient was prepped and draped in the usual sterile fashion. The correct patient, procedure, and site was verified.   Injection Procedure Details:   Procedure diagnoses: Lumbar radiculopathy [M54.16]   Meds Administered:  Meds ordered this encounter   Medications   methylPREDNISolone  acetate (DEPO-MEDROL ) injection 40 mg     Laterality: Midline  Location/Site:  L4-5, L5 sacralized   Needle: 3.5 in., 20 ga. Tuohy  Needle Placement: Paramedian epidural  Findings:   -Comments: Excellent flow of contrast into the epidural space.  Procedure Details: Using a paramedian approach from the side mentioned above, the region overlying the inferior lamina was localized under fluoroscopic visualization and the soft tissues overlying this structure were infiltrated with 4 ml. of 1% Lidocaine  without Epinephrine . The Tuohy needle was inserted into the epidural space using a paramedian approach.   The epidural space was localized using loss of resistance along with counter oblique bi-planar fluoroscopic views.  After negative aspirate for air, blood, and CSF, a 2 ml. volume of Isovue-250 was injected into the epidural space and the flow of contrast was observed. Radiographs were obtained for documentation purposes.    The injectate was administered into the level noted above.   Additional Comments:  The patient tolerated the procedure well Dressing: 2 x 2 sterile gauze and Band-Aid    Post-procedure details: Patient was observed during the procedure. Post-procedure instructions were reviewed.  Patient left the clinic in stable condition.   Clinical History: CLINICAL DATA:  Low back pain with left-sided sciatic pain.   EXAM: MRI LUMBAR SPINE WITHOUT CONTRAST   TECHNIQUE: Multiplanar, multisequence MR imaging of the lumbar spine was performed. No intravenous contrast was administered.   COMPARISON:  Radiography 12/16/2016   FINDINGS: Segmentation: Based on lumbar radiographs, L5 is sacralized. Correlation with this numbering terminology would  be important should intervention be contemplated.   Alignment: Degenerative anterolisthesis at L3-4 of 5 mm and at L4-5 of 3 mm.   Vertebrae: Degenerative endplate marrow edema at L3-4  could relate to regional pain.   Conus medullaris and cauda equina: Conus extends to the L1-2 level. Conus and cauda equina appear normal.   Paraspinal and other soft tissues: Negative   Disc levels:   T12-L1 and L1-2: Minimal noncompressive disc bulges.   L2-3: Shallow protrusion of the disc. Bilateral facet and ligamentous hypertrophy. Mild stenosis of the lateral recesses at this level but without definite neural compression.   L3-4: Advanced bilateral facet arthropathy with anterolisthesis of 5 mm. Shallow protrusion of the disc. Severe multifactorial stenosis that could cause neural compression on either or both sides. Discogenic endplate marrow changes could relate to regional pain.   L4-5: Bilateral facet arthropathy with anterolisthesis of 3 mm. There may be fusion or developing fusion across these facet joints. No compressive stenosis of the canal or foramina.   L5-S1: Rudimentary and unremarkable.   IMPRESSION: 1. Based on lumbar radiographs, L5 is sacralized. Correlation with this numbering terminology would be important should intervention be contemplated. 2. L3-4: Advanced bilateral facet arthropathy with anterolisthesis of 5 mm. Shallow protrusion of the disc. Severe multifactorial stenosis that could cause neural compression on either or both sides. Discogenic endplate marrow changes could relate to regional pain. 3. L4-5: Bilateral facet arthropathy with anterolisthesis of 3 mm. There may be fusion or developing fusion across these facet joints. No compressive stenosis of the canal or foramina. 4. L2-3: Shallow protrusion of the disc. Bilateral facet and ligamentous hypertrophy. Mild stenosis of the lateral recesses at this level but without definite neural compression.     Electronically Signed   By: Oneil Officer M.D.   On: 09/16/2023 09:56     Objective:  VS:  HT:    WT:   BMI:     BP:130/70  HR:62bpm  TEMP: ( )  RESP:  Physical Exam Vitals  and nursing note reviewed.  Constitutional:      General: She is not in acute distress.    Appearance: Normal appearance. She is not ill-appearing.  HENT:     Head: Normocephalic and atraumatic.     Right Ear: External ear normal.     Left Ear: External ear normal.  Eyes:     Extraocular Movements: Extraocular movements intact.  Cardiovascular:     Rate and Rhythm: Normal rate.     Pulses: Normal pulses.  Pulmonary:     Effort: Pulmonary effort is normal. No respiratory distress.  Abdominal:     General: There is no distension.     Palpations: Abdomen is soft.  Musculoskeletal:        General: Tenderness present.     Cervical back: Neck supple.     Right lower leg: No edema.     Left lower leg: No edema.     Comments: Patient has good distal strength with no pain over the greater trochanters.  No clonus or focal weakness.  Skin:    Findings: No erythema, lesion or rash.  Neurological:     General: No focal deficit present.     Mental Status: She is alert and oriented to person, place, and time.     Sensory: No sensory deficit.     Motor: No weakness or abnormal muscle tone.     Coordination: Coordination normal.  Psychiatric:        Mood  and Affect: Mood normal.        Behavior: Behavior normal.      Imaging: No results found.

## 2024-01-07 ENCOUNTER — Ambulatory Visit: Admitting: Orthopedic Surgery

## 2024-01-07 DIAGNOSIS — M48062 Spinal stenosis, lumbar region with neurogenic claudication: Secondary | ICD-10-CM

## 2024-01-07 NOTE — Progress Notes (Signed)
 Orthopedic Spine Surgery Office Note   Assessment: Patient is a 78 y.o. female with low back pain that radiates into bilateral lower extremities.  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  There is a spondylolisthesis and PI-LL mismatch as well     Plan: -Patient has tried PT, Tylenol , meloxicam , gabapentin , acupuncture, lumbar steroid injection -Patient has not tried conservative treatments for over 6 weeks with me and has done conservative treatments for even longer with her primary care provider.  She has not had symptoms for over 2 years, so discussed operative management as a option.  After discussion, patient elected to proceed -Patient will next be seen at date of surgery     The patient has symptoms consistent with lumbar stenosis and neurogenic claudication. The patient's symptoms did not improve with conservative treatment so operative management was discussed in the form of L4/5 lateral interbody fusion and posterior instrumented spinal fusion. This would be done to address her spondylolisthesis and to indirectly decompress. The risks including but not limited to dural tear, nerve root injury, paralysis, pseudarthrosis, persistent pain, infection, bleeding, hardware failure, adjacent segment disease, vascular injury, psoas weakness, lumbar plexus injury, heart attack, death, fracture, and need for additional procedures were discussed with the patient. Since this will be an indirect decompression, there is a risk of persistent symptoms which the patient understood after explaining the reason. The benefit of the surgery would be improvement in the patient's radiating leg pain. I explained that back pain relief is not the goal of the surgery and it is not reliably alleviated with this surgery. The alternatives to surgical management were covered with the patient and included continued monitoring, physical therapy, over-the-counter pain medications, ambulatory aids, repeat injections, and  activity modification. All the patient's questions were answered to her satisfaction. After this discussion, the patient expressed understanding and elected to proceed with surgical intervention.      ___________________________________________________________________________     History:   Patient is a 78 y.o. female who presents today for follow up on his lumbar spine.  Patient has now had over 2 years of low back pain that radiates into her bilateral lateral hips and buttocks.  She also feels that radiating along the lateral aspect of her leg to the level of the ankles.  The pain is still worse if she is standing or walking and gets better if she sits down.  After her last visit, she got an injection with Dr. Eldonna and she did not find that helpful at all.  She said that her pain was actually worse afterwards.  Pain has now returned to how it was prior to the injection.  She has not noticed any new symptoms since she was last seen in the office.   Treatments tried: PT, Tylenol , meloxicam , gabapentin , acupuncture, lumbar steroid injection     Physical Exam:   General: no acute distress, appears stated age Neurologic: alert, answering questions appropriately, following commands Respiratory: unlabored breathing on room air, symmetric chest rise Psychiatric: appropriate affect, normal cadence to speech     MSK (spine):   -Strength exam                                                   Left                  Right  EHL                              5/5                  5/5 TA                                 5/5                  5/5 GSC                             5/5                  5/5 Knee extension            5/5                  5/5 Hip flexion                    5/5                  5/5   -Sensory exam                           Sensation intact to light touch in L3-S1 nerve distributions of bilateral lower extremities   Imaging: XRs of the lumbar spine from 11/26/2023 were  previously independently reviewed and interpreted, showing disc height loss at L3/4 and L4/5. Spondylolisthesis seen at L4/5. No fracture or dislocation seen. PI of 51, LL of 26.   MRI of the lumbar spine from 09/01/2023 was previously independently reviewed and interpreted, showing DDD at L4/5.  Spondylolisthesis at L4/5.  Central, lateral recess, and right-sided foraminal stenosis at L4/5.  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   DEXA scan from 02/12/2021 showed a T-score of 0.6.     Patient name: Brooke Villa Patient MRN: [id number] Date of visit: 01/07/24

## 2024-01-17 ENCOUNTER — Encounter: Payer: Self-pay | Admitting: Orthopedic Surgery

## 2024-02-04 DIAGNOSIS — Z0000 Encounter for general adult medical examination without abnormal findings: Secondary | ICD-10-CM | POA: Diagnosis not present

## 2024-02-16 DIAGNOSIS — R801 Persistent proteinuria, unspecified: Secondary | ICD-10-CM | POA: Diagnosis not present

## 2024-02-16 DIAGNOSIS — N1832 Chronic kidney disease, stage 3b: Secondary | ICD-10-CM | POA: Diagnosis not present

## 2024-02-16 DIAGNOSIS — E1129 Type 2 diabetes mellitus with other diabetic kidney complication: Secondary | ICD-10-CM | POA: Diagnosis not present

## 2024-02-17 DIAGNOSIS — Z130 Encounter for screening for diseases of the blood and blood-forming organs and certain disorders involving the immune mechanism: Secondary | ICD-10-CM | POA: Diagnosis not present

## 2024-02-17 DIAGNOSIS — E1129 Type 2 diabetes mellitus with other diabetic kidney complication: Secondary | ICD-10-CM | POA: Diagnosis not present

## 2024-02-17 DIAGNOSIS — M5416 Radiculopathy, lumbar region: Secondary | ICD-10-CM | POA: Diagnosis not present

## 2024-02-17 DIAGNOSIS — I10 Essential (primary) hypertension: Secondary | ICD-10-CM | POA: Diagnosis not present

## 2024-02-17 DIAGNOSIS — G500 Trigeminal neuralgia: Secondary | ICD-10-CM | POA: Diagnosis not present

## 2024-02-17 DIAGNOSIS — N1832 Chronic kidney disease, stage 3b: Secondary | ICD-10-CM | POA: Diagnosis not present

## 2024-02-17 DIAGNOSIS — K219 Gastro-esophageal reflux disease without esophagitis: Secondary | ICD-10-CM | POA: Diagnosis not present

## 2024-02-17 DIAGNOSIS — Z79899 Other long term (current) drug therapy: Secondary | ICD-10-CM | POA: Diagnosis not present

## 2024-02-17 DIAGNOSIS — E782 Mixed hyperlipidemia: Secondary | ICD-10-CM | POA: Diagnosis not present

## 2024-02-17 DIAGNOSIS — R609 Edema, unspecified: Secondary | ICD-10-CM | POA: Diagnosis not present

## 2024-02-17 DIAGNOSIS — R801 Persistent proteinuria, unspecified: Secondary | ICD-10-CM | POA: Diagnosis not present

## 2024-02-22 ENCOUNTER — Other Ambulatory Visit (HOSPITAL_BASED_OUTPATIENT_CLINIC_OR_DEPARTMENT_OTHER): Payer: Self-pay | Admitting: Family Medicine

## 2024-02-22 DIAGNOSIS — E782 Mixed hyperlipidemia: Secondary | ICD-10-CM

## 2024-03-17 DIAGNOSIS — H35372 Puckering of macula, left eye: Secondary | ICD-10-CM | POA: Diagnosis not present

## 2024-03-17 DIAGNOSIS — Z961 Presence of intraocular lens: Secondary | ICD-10-CM | POA: Diagnosis not present

## 2024-03-17 DIAGNOSIS — E119 Type 2 diabetes mellitus without complications: Secondary | ICD-10-CM | POA: Diagnosis not present

## 2024-04-13 NOTE — Progress Notes (Signed)
 Surgical Instructions   Your procedure is scheduled on Tuesday, November 4th. Report to [facility] Main Entrance A at 5:30 A.M., then check in with the Admitting office. Any questions or running late day of surgery: call [phone]  Questions prior to your surgery date: call [phone], Monday-Friday, 8am-4pm. If you experience any cold or flu symptoms such as cough, fever, chills, shortness of breath, etc. between now and your scheduled surgery, please notify us  at the above number.     Remember:  Do not eat after midnight the night before your surgery  You may drink clear liquids until 4:30 the morning of your surgery.   Clear liquids allowed are: Water, Non-Citrus Juices (without pulp), Carbonated Beverages, Clear Tea (no milk, honey, etc.), Black Coffee Only (NO MILK, CREAM OR POWDERED CREAMER of any kind), and Gatorade.    Take these medicines the morning of surgery with A SIP OF WATER  amLODipine  (NORVASC )  gabapentin  (NEURONTIN )  metoprolol  succinate (TOPROL -XL)  pantoprazole  (PROTONIX )    May take these medicines IF NEEDED: traMADol (ULTRAM)    HOLD your FARXIGA for 3 day's prior to surgery.  Last dose should be taken on Friday, October 30th.   One week prior to surgery, STOP taking any Aspirin (unless otherwise instructed by your surgeon) Aleve, Naproxen, Ibuprofen, Motrin, Advil, Goody's, BC's, all herbal medications, fish oil, and non-prescription vitamins. This includes your meloxicam  (MOBIC ).    WHAT DO I DO ABOUT MY DIABETES MEDICATION?   Do not take oral diabetes medicines metFORMIN  (GLUCOPHAGE -XR) the morning of surgery.   HOW TO MANAGE YOUR DIABETES BEFORE AND AFTER SURGERY  Why is it important to control my blood sugar before and after surgery? Improving blood sugar levels before and after surgery helps healing and can limit problems. A way of improving blood sugar control is eating a healthy diet by:  Eating less sugar and carbohydrates   Increasing activity/exercise  Talking with your doctor about reaching your blood sugar goals High blood sugars (greater than 180 mg/dL) can raise your risk of infections and slow your recovery, so you will need to focus on controlling your diabetes during the weeks before surgery. Make sure that the doctor who takes care of your diabetes knows about your planned surgery including the date and location.  How do I manage my blood sugar before surgery? Check your blood sugar at least 4 times a day, starting 2 days before surgery, to make sure that the level is not too high or low.  Check your blood sugar the morning of your surgery when you wake up and every 2 hours until you get to the Short Stay unit.  If your blood sugar is less than 70 mg/dL, you will need to treat for low blood sugar: Do not take insulin . Treat a low blood sugar (less than 70 mg/dL) with  cup of clear juice (cranberry or apple), 4 glucose tablets, OR glucose gel. Recheck blood sugar in 15 minutes after treatment (to make sure it is greater than 70 mg/dL). If your blood sugar is not greater than 70 mg/dL on recheck, call [phone] for further instructions. Report your blood sugar to the short stay nurse when you get to Short Stay.  If you are admitted to the hospital after surgery: Your blood sugar will be checked by the staff and you will probably be given insulin  after surgery (instead of oral diabetes medicines) to make sure you have good blood sugar levels. The goal for blood sugar control after surgery  is 80-180 mg/dL.                    Do NOT Smoke (Tobacco/Vaping) for 24 hours prior to your procedure.  If you use a CPAP at night, you may bring your mask/headgear for your overnight stay.   You will be asked to remove any contacts, glasses, piercing's, hearing aid's, dentures/partials prior to surgery. Please bring cases for these items if needed.    Patients discharged the day of surgery will not be allowed to  drive home, and someone needs to stay with them for 24 hours.  SURGICAL WAITING ROOM VISITATION Patients may have no more than 2 support people in the waiting area - these visitors may rotate.   Pre-op nurse will coordinate an appropriate time for 1 ADULT support person, who may not rotate, to accompany patient in pre-op.  Children under the age of 79 must have an adult with them who is not the patient and must remain in the main waiting area with an adult.  If the patient needs to stay at the hospital during part of their recovery, the visitor guidelines for inpatient rooms apply.  Please refer to the [facility] website for the visitor guidelines for any additional information.   If you received a COVID test during your pre-op visit  it is requested that you wear a mask when out in public, stay away from anyone that may not be feeling well and notify your surgeon if you develop symptoms. If you have been in contact with anyone that has tested positive in the last 10 days please notify you surgeon.      Pre-operative 4 CHG Bathing Instructions   You can play a key role in reducing the risk of infection after surgery. Your skin needs to be as free of germs as possible. You can reduce the number of germs on your skin by washing with CHG (chlorhexidine gluconate) soap before surgery. CHG is an antiseptic soap that kills germs and continues to kill germs even after washing.   DO NOT use if you have an allergy to chlorhexidine/CHG or antibacterial soaps. If your skin becomes reddened or irritated, stop using the CHG and notify one of our RNs at [phone].   Please shower with the CHG soap starting 4 days before surgery using the following schedule:     Please keep in mind the following:  DO NOT shave, including legs and underarms, starting the day of your first shower.   Place clean sheets on your bed the day you start using CHG soap. Use a clean washcloth (not used since being washed)  for each shower. DO NOT sleep with pets once you start using the CHG.   CHG Shower Instructions:  Wash your face and private area with normal soap. If you choose to wash your hair, wash first with your normal shampoo.  After you use shampoo/soap, rinse your hair and body thoroughly to remove shampoo/soap residue.  Turn the water OFF and apply  bottle of CHG soap to a CLEAN washcloth.  Apply CHG soap ONLY FROM YOUR NECK DOWN TO YOUR TOES (washing for 3-5 minutes)  DO NOT use CHG soap on face, private areas, open wounds, or sores.  Pay special attention to the area where your surgery is being performed.  If you are having back surgery, having someone wash your back for you may be helpful. Wait 2 minutes after CHG soap is applied, then you may rinse off the  CHG soap.  Pat dry with a clean towel  Put on clean clothes/pajamas   If you choose to wear lotion, please use ONLY the CHG-compatible lotions that are listed below.  Additional instructions for the day of surgery:  If you choose, you may shower the morning of surgery with an antibacterial soap.  DO NOT APPLY any lotions, deodorants, powders, or perfumes.   Do not bring valuables to the hospital. [facility] is not responsible for any belongings/valuables. Do not wear nail polish, gel polish, artificial nails, or any other type of covering on natural nails (fingers and toes) Do not wear jewelry or makeup Put on clean/comfortable clothes.  Please brush your teeth.  Ask your nurse before applying any prescription medications to the skin.     CHG Compatible Lotions   Aveeno Moisturizing lotion  Cetaphil Moisturizing Cream  Cetaphil Moisturizing Lotion  Clairol Herbal Essence Moisturizing Lotion, Dry Skin  Clairol Herbal Essence Moisturizing Lotion, Extra Dry Skin  Clairol Herbal Essence Moisturizing Lotion, Normal Skin  Curel Age Defying Therapeutic Moisturizing Lotion with Alpha Hydroxy  Curel Extreme Care Body Lotion  Curel  Soothing Hands Moisturizing Hand Lotion  Curel Therapeutic Moisturizing Cream, Fragrance-Free  Curel Therapeutic Moisturizing Lotion, Fragrance-Free  Curel Therapeutic Moisturizing Lotion, Original Formula  Eucerin Daily Replenishing Lotion  Eucerin Dry Skin Therapy Plus Alpha Hydroxy Crme  Eucerin Dry Skin Therapy Plus Alpha Hydroxy Lotion  Eucerin Original Crme  Eucerin Original Lotion  Eucerin Plus Crme Eucerin Plus Lotion  Eucerin TriLipid Replenishing Lotion  Keri Anti-Bacterial Hand Lotion  Keri Deep Conditioning Original Lotion Dry Skin Formula Softly Scented  Keri Deep Conditioning Original Lotion, Fragrance Free Sensitive Skin Formula  Keri Lotion Fast Absorbing Fragrance Free Sensitive Skin Formula  Keri Lotion Fast Absorbing Softly Scented Dry Skin Formula  Keri Original Lotion  Keri Skin Renewal Lotion Keri Silky Smooth Lotion  Keri Silky Smooth Sensitive Skin Lotion  Nivea Body Creamy Conditioning Oil  Nivea Body Extra Enriched Lotion  Nivea Body Original Lotion  Nivea Body Sheer Moisturizing Lotion Nivea Crme  Nivea Skin Firming Lotion  NutraDerm 30 Skin Lotion  NutraDerm Skin Lotion  NutraDerm Therapeutic Skin Cream  NutraDerm Therapeutic Skin Lotion  ProShield Protective Hand Cream  Provon moisturizing lotion  Please read over the following fact sheets that you were given.

## 2024-04-14 ENCOUNTER — Other Ambulatory Visit: Payer: Self-pay

## 2024-04-14 ENCOUNTER — Encounter (HOSPITAL_COMMUNITY): Payer: Self-pay

## 2024-04-14 ENCOUNTER — Encounter (HOSPITAL_COMMUNITY)
Admission: RE | Admit: 2024-04-14 | Discharge: 2024-04-14 | Disposition: A | Discharge status: Home / Self Care | Source: Ambulatory Visit | Attending: Orthopedic Surgery | Admitting: Orthopedic Surgery

## 2024-04-14 VITALS — BP 183/71 | HR 69 | Temp 98.3°F | Resp 16 | Ht 63.0 in | Wt 199.9 lb

## 2024-04-14 DIAGNOSIS — E119 Type 2 diabetes mellitus without complications: Secondary | ICD-10-CM | POA: Diagnosis not present

## 2024-04-14 DIAGNOSIS — Z01818 Encounter for other preprocedural examination: Secondary | ICD-10-CM | POA: Diagnosis present

## 2024-04-14 LAB — BASIC METABOLIC PANEL WITH GFR
Anion gap: 9 (ref 5–15)
BUN: 13 mg/dL (ref 8–23)
CO2: 26 mmol/L (ref 22–32)
Calcium: 9.7 mg/dL (ref 8.9–10.3)
Chloride: 107 mmol/L (ref 98–111)
Creatinine, Ser: 1.12 mg/dL — ABNORMAL HIGH (ref 0.44–1.00)
GFR, Estimated: 50 mL/min — ABNORMAL LOW (ref >60)
Glucose, Bld: 125 mg/dL — ABNORMAL HIGH (ref 70–99)
Potassium: 3.3 mmol/L — ABNORMAL LOW (ref 3.5–5.1)
Sodium: 142 mmol/L (ref 135–145)

## 2024-04-14 LAB — CBC
HCT: 42.3 % (ref 36.0–46.0)
Hemoglobin: 13.5 g/dL (ref 12.0–15.0)
MCH: 25.2 pg — ABNORMAL LOW (ref 26.0–34.0)
MCHC: 31.9 g/dL (ref 30.0–36.0)
MCV: 79.1 fL — ABNORMAL LOW (ref 80.0–100.0)
Platelets: 212 K/uL (ref 150–400)
RBC: 5.35 MIL/uL — ABNORMAL HIGH (ref 3.87–5.11)
RDW: 15.6 % — ABNORMAL HIGH (ref 11.5–15.5)
WBC: 6.3 K/uL (ref 4.0–10.5)
nRBC: 0 % (ref 0.0–0.2)

## 2024-04-14 LAB — HEMOGLOBIN A1C
Hgb A1c MFr Bld: 5.9 % — ABNORMAL HIGH (ref 4.8–5.6)
Mean Plasma Glucose: 122.63 mg/dL

## 2024-04-14 LAB — TYPE AND SCREEN
ABO/RH(D): O POS
Antibody Screen: NEGATIVE

## 2024-04-14 LAB — GLUCOSE, CAPILLARY: Glucose-Capillary: 135 mg/dL — ABNORMAL HIGH (ref 70–99)

## 2024-04-14 LAB — SURGICAL PCR SCREEN
MRSA, PCR: NEGATIVE
Staphylococcus aureus: NEGATIVE

## 2024-04-14 NOTE — Progress Notes (Signed)
 PCP - Sari Pay Cardiologist - Denies  PPM/ICD -Denies  Device Orders - N/A Rep Notified - N/A  Chest x-ray - N/A EKG - 04-14-24 Stress Test - Denies ECHO - Denies Cardiac Cath - Denies  Sleep Study - Denies CPAP - N/A  Fasting Blood Sugar - 90-110 Checks Blood Sugar - per patient does not ever check her blood sugar  Last dose of GLP1 agonist-  Denies GLP1 instructions: n/a  Blood Thinner Instructions:Denies Aspirin Instructions:Denies  ERAS Protcol - clears until 0430 PRE-SURGERY Ensure or G2- G2  COVID TEST- n/a   Anesthesia review: Yes, HTN,w/new EKG, DM  Patient denies shortness of breath, fever, cough and chest pain at PAT appointment   All instructions explained to the patient, with a verbal understanding of the material. Patient agrees to go over the instructions while at home for a better understanding. Patient also instructed to self quarantine after being tested for COVID-19. The opportunity to ask questions was provided.

## 2024-04-18 ENCOUNTER — Encounter: Payer: Self-pay | Admitting: Radiology

## 2024-04-18 NOTE — Anesthesia Preprocedure Evaluation (Signed)
 Anesthesia Evaluation  Patient identified by MRN, date of birth, ID band Patient awake    Reviewed: Allergy & Precautions, NPO status , Patient's Chart, lab work & pertinent test results  History of Anesthesia Complications Negative for: history of anesthetic complications  Airway Mallampati: III  TM Distance: >3 FB Neck ROM: Full    Dental  (+) Dental Advisory Given, Partial Upper, Partial Lower   Pulmonary neg pulmonary ROS   Pulmonary exam normal breath sounds clear to auscultation       Cardiovascular hypertension (amlodipine , metoprolol , telmisartan), Pt. on medications and Pt. 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)    GI/Hepatic Neg liver ROS,GERD  Medicated,,  Endo/Other  diabetes (Hgb A1c 5.9), Well Controlled, Type 2    Renal/GU negative Renal ROS     Musculoskeletal  (+) Arthritis , Osteoarthritis,    Abdominal  (+) + obese  Peds  Hematology negative hematology ROS (+) Lab Results      Component                Value               Date                      WBC                      6.3                 04/14/2024                HGB                      13.5                04/14/2024                HCT                      42.3                04/14/2024                MCV                      79.1 (L)            04/14/2024                PLT                      212                 04/14/2024              Anesthesia Other Findings Last Farxiga: 4 days ago  Reproductive/Obstetrics                              Anesthesia Physical Anesthesia Plan  ASA: 2  Anesthesia Plan: General   Post-op Pain Management: Tylenol  PO (pre-op)* and Dilaudid  IV   Induction: Intravenous  PONV Risk Score and Plan:  3 and Ondansetron , Dexamethasone  and Treatment may vary due to age or medical  condition  Airway Management Planned: Oral ETT  Additional Equipment: ClearSight  Intra-op Plan:   Post-operative Plan: Extubation in OR  Informed Consent: I have reviewed the patients History and Physical, chart, labs and discussed the procedure including the risks, benefits and alternatives for the proposed anesthesia with the patient or authorized representative who has indicated his/her understanding and acceptance.     Dental advisory given  Plan Discussed with: CRNA and Anesthesiologist  Anesthesia Plan Comments: (Risks of general anesthesia discussed including, but not limited to, sore throat, hoarse voice, chipped/damaged teeth, injury to vocal cords, nausea and vomiting, allergic reactions, lung infection, heart attack, stroke, and death. All questions answered. )         Anesthesia Quick Evaluation

## 2024-04-19 ENCOUNTER — Inpatient Hospital Stay (HOSPITAL_COMMUNITY)

## 2024-04-19 ENCOUNTER — Encounter (HOSPITAL_COMMUNITY): Payer: Self-pay | Admitting: Orthopedic Surgery

## 2024-04-19 ENCOUNTER — Other Ambulatory Visit: Payer: Self-pay

## 2024-04-19 ENCOUNTER — Inpatient Hospital Stay (HOSPITAL_COMMUNITY): Payer: Self-pay | Admitting: Physician Assistant

## 2024-04-19 ENCOUNTER — Encounter (HOSPITAL_COMMUNITY)
Admission: RE | Disposition: A | Discharge status: Home / Self Care | Payer: Self-pay | Source: Home / Self Care | Attending: Orthopedic Surgery

## 2024-04-19 ENCOUNTER — Inpatient Hospital Stay (HOSPITAL_COMMUNITY): Admitting: Anesthesiology

## 2024-04-19 ENCOUNTER — Inpatient Hospital Stay (HOSPITAL_COMMUNITY)
Admission: RE | Admit: 2024-04-19 | Discharge: 2024-04-21 | DRG: 402 | Disposition: A | Discharge status: Home / Self Care | Attending: Orthopedic Surgery | Admitting: Orthopedic Surgery

## 2024-04-19 DIAGNOSIS — D62 Acute posthemorrhagic anemia: Secondary | ICD-10-CM | POA: Diagnosis not present

## 2024-04-19 DIAGNOSIS — M545 Low back pain, unspecified: Secondary | ICD-10-CM | POA: Diagnosis present

## 2024-04-19 DIAGNOSIS — Z6835 Body mass index (BMI) 35.0-35.9, adult: Secondary | ICD-10-CM

## 2024-04-19 DIAGNOSIS — E669 Obesity, unspecified: Secondary | ICD-10-CM | POA: Diagnosis present

## 2024-04-19 DIAGNOSIS — K219 Gastro-esophageal reflux disease without esophagitis: Secondary | ICD-10-CM | POA: Diagnosis present

## 2024-04-19 DIAGNOSIS — M48062 Spinal stenosis, lumbar region with neurogenic claudication: Principal | ICD-10-CM | POA: Diagnosis present

## 2024-04-19 DIAGNOSIS — Z01818 Encounter for other preprocedural examination: Principal | ICD-10-CM

## 2024-04-19 DIAGNOSIS — M4316 Spondylolisthesis, lumbar region: Secondary | ICD-10-CM | POA: Diagnosis present

## 2024-04-19 DIAGNOSIS — E785 Hyperlipidemia, unspecified: Secondary | ICD-10-CM | POA: Diagnosis present

## 2024-04-19 DIAGNOSIS — I10 Essential (primary) hypertension: Secondary | ICD-10-CM | POA: Diagnosis present

## 2024-04-19 DIAGNOSIS — Z96659 Presence of unspecified artificial knee joint: Secondary | ICD-10-CM | POA: Diagnosis present

## 2024-04-19 DIAGNOSIS — Z981 Arthrodesis status: Secondary | ICD-10-CM

## 2024-04-19 DIAGNOSIS — E119 Type 2 diabetes mellitus without complications: Secondary | ICD-10-CM

## 2024-04-19 HISTORY — PX: ANTERIOR LAT LUMBAR FUSION: SHX1168

## 2024-04-19 LAB — GLUCOSE, CAPILLARY
Glucose-Capillary: 110 mg/dL — ABNORMAL HIGH (ref 70–99)
Glucose-Capillary: 180 mg/dL — ABNORMAL HIGH (ref 70–99)
Glucose-Capillary: 164 mg/dL — ABNORMAL HIGH (ref 70–99)
Glucose-Capillary: 161 mg/dL — ABNORMAL HIGH (ref 70–99)

## 2024-04-19 SURGERY — ANTERIOR LATERAL LUMBAR FUSION 1 LEVEL
Anesthesia: General

## 2024-04-19 MED ORDER — ONDANSETRON HCL 4 MG/2ML IJ SOLN
INTRAMUSCULAR | Status: AC
  Filled 2024-04-19: qty 2

## 2024-04-19 MED ORDER — TRANEXAMIC ACID-NACL 1000-0.7 MG/100ML-% IV SOLN
INTRAVENOUS | Status: AC
  Filled 2024-04-19: qty 100

## 2024-04-19 MED ORDER — ONDANSETRON HCL 4 MG/2ML IJ SOLN: 4.0000 mg | Freq: Four times a day (QID) | INTRAMUSCULAR | Status: DC | PRN

## 2024-04-19 MED ORDER — ACETAMINOPHEN 500 MG PO TABS
1000.0000 mg | ORAL_TABLET | Freq: Three times a day (TID) | ORAL | Status: DC
  Administered 2024-04-19 – 2024-04-21 (×6): 1000 mg via ORAL
  Filled 2024-04-19 (×6): qty 2

## 2024-04-19 MED ORDER — PHENYLEPHRINE HCL-NACL 20-0.9 MG/250ML-% IV SOLN
INTRAVENOUS | Status: DC | PRN
  Administered 2024-04-19: 40 ug/min via INTRAVENOUS

## 2024-04-19 MED ORDER — ONDANSETRON HCL 4 MG/2ML IJ SOLN
INTRAMUSCULAR | Status: DC | PRN
  Administered 2024-04-19: 4 mg via INTRAVENOUS

## 2024-04-19 MED ORDER — SENNA 8.6 MG PO TABS
1.0000 | ORAL_TABLET | Freq: Two times a day (BID) | ORAL | Status: DC
  Administered 2024-04-19: 8.6 mg via ORAL
  Filled 2024-04-19: qty 1

## 2024-04-19 MED ORDER — MIDAZOLAM HCL (PF) 2 MG/2ML IJ SOLN
INTRAMUSCULAR | Status: DC | PRN
  Administered 2024-04-19: 1 mg via INTRAVENOUS

## 2024-04-19 MED ORDER — OXYCODONE HCL 5 MG/5ML PO SOLN
ORAL | Status: AC
  Filled 2024-04-19: qty 5

## 2024-04-19 MED ORDER — FENTANYL CITRATE (PF) 250 MCG/5ML IJ SOLN
INTRAMUSCULAR | Status: DC | PRN
Start: 2024-04-19 — End: 2024-04-19
  Administered 2024-04-19: 50 ug via INTRAVENOUS
  Administered 2024-04-19: 100 ug via INTRAVENOUS

## 2024-04-19 MED ORDER — ORAL CARE MOUTH RINSE: 15.0000 mL | Freq: Once | OROMUCOSAL | Status: AC

## 2024-04-19 MED ORDER — SUCCINYLCHOLINE CHLORIDE 200 MG/10ML IV SOSY
PREFILLED_SYRINGE | INTRAVENOUS | Status: AC
  Filled 2024-04-19: qty 10

## 2024-04-19 MED ORDER — OXYCODONE HCL 5 MG PO TABS: 5.0000 mg | ORAL_TABLET | Freq: Once | ORAL | Status: AC | PRN

## 2024-04-19 MED ORDER — GABAPENTIN 300 MG PO CAPS
1200.0000 mg | ORAL_CAPSULE | Freq: Three times a day (TID) | ORAL | Status: DC
  Administered 2024-04-19: 1200 mg via ORAL
  Filled 2024-04-19 (×3): qty 4

## 2024-04-19 MED ORDER — METOPROLOL SUCCINATE ER 100 MG PO TB24: 100.0000 mg | ORAL_TABLET | Freq: Every day | ORAL | Status: DC

## 2024-04-19 MED ORDER — SUCCINYLCHOLINE CHLORIDE 200 MG/10ML IV SOSY
PREFILLED_SYRINGE | INTRAVENOUS | Status: DC | PRN
  Administered 2024-04-19: 120 mg via INTRAVENOUS

## 2024-04-19 MED ORDER — CEFAZOLIN SODIUM-DEXTROSE 2-4 GM/100ML-% IV SOLN
INTRAVENOUS | Status: AC
  Filled 2024-04-19: qty 100

## 2024-04-19 MED ORDER — CEFAZOLIN SODIUM-DEXTROSE 2-4 GM/100ML-% IV SOLN
2.0000 g | INTRAVENOUS | Status: AC
  Administered 2024-04-19: 2 g via INTRAVENOUS

## 2024-04-19 MED ORDER — HYDROMORPHONE HCL 1 MG/ML IJ SOLN: 0.5000 mg | INTRAMUSCULAR | Status: DC | PRN

## 2024-04-19 MED ORDER — DEXAMETHASONE SOD PHOSPHATE PF 10 MG/ML IJ SOLN
10.0000 mg | Freq: Once | INTRAMUSCULAR | Status: AC
  Administered 2024-04-19: 10 mg via INTRAVENOUS

## 2024-04-19 MED ORDER — BUPIVACAINE-EPINEPHRINE 0.25% -1:200000 IJ SOLN
INTRAMUSCULAR | Status: DC | PRN
  Administered 2024-04-19 (×2): 30 mL

## 2024-04-19 MED ORDER — ACETAMINOPHEN 500 MG PO TABS: 1000.0000 mg | ORAL_TABLET | Freq: Once | ORAL | Status: AC

## 2024-04-19 MED ORDER — TRANEXAMIC ACID-NACL 1000-0.7 MG/100ML-% IV SOLN
1000.0000 mg | Freq: Once | INTRAVENOUS | Status: AC
  Administered 2024-04-19: 1000 mg via INTRAVENOUS
  Filled 2024-04-19: qty 100

## 2024-04-19 MED ORDER — ONDANSETRON HCL 4 MG PO TABS: 4.0000 mg | ORAL_TABLET | Freq: Four times a day (QID) | ORAL | Status: DC | PRN

## 2024-04-19 MED ORDER — 0.9 % SODIUM CHLORIDE (POUR BTL) OPTIME
TOPICAL | Status: DC | PRN
  Administered 2024-04-19: 1000 mL

## 2024-04-19 MED ORDER — ACETAMINOPHEN 500 MG PO TABS
ORAL_TABLET | ORAL | Status: AC
  Administered 2024-04-19: 1000 mg via ORAL
  Filled 2024-04-19: qty 2

## 2024-04-19 MED ORDER — INSULIN ASPART 100 UNIT/ML IJ SOLN: 0.0000 [IU] | Freq: Every day | INTRAMUSCULAR | Status: DC

## 2024-04-19 MED ORDER — LIDOCAINE 2% (20 MG/ML) 5 ML SYRINGE
INTRAMUSCULAR | Status: DC | PRN
  Administered 2024-04-19: 100 mg via INTRAVENOUS

## 2024-04-19 MED ORDER — AMISULPRIDE (ANTIEMETIC) 5 MG/2ML IV SOLN: 10.0000 mg | Freq: Once | INTRAVENOUS | Status: DC | PRN

## 2024-04-19 MED ORDER — OXYCODONE HCL 5 MG/5ML PO SOLN
5.0000 mg | Freq: Once | ORAL | Status: AC | PRN
  Administered 2024-04-19: 5 mg via ORAL

## 2024-04-19 MED ORDER — MIDAZOLAM HCL 2 MG/2ML IJ SOLN
INTRAMUSCULAR | Status: AC
  Filled 2024-04-19: qty 2

## 2024-04-19 MED ORDER — PROPOFOL 10 MG/ML IV BOLUS
INTRAVENOUS | Status: DC | PRN
  Administered 2024-04-19: 130 mg via INTRAVENOUS
  Administered 2024-04-19: 75 ug/kg/min via INTRAVENOUS

## 2024-04-19 MED ORDER — INSULIN ASPART 100 UNIT/ML IJ SOLN: 0.0000 [IU] | INTRAMUSCULAR | Status: DC | PRN

## 2024-04-19 MED ORDER — VANCOMYCIN HCL 1000 MG IV SOLR
INTRAVENOUS | Status: DC | PRN
  Administered 2024-04-19: 1000 mg via TOPICAL

## 2024-04-19 MED ORDER — GLUCERNA SHAKE PO LIQD
237.0000 mL | Freq: Two times a day (BID) | ORAL | Status: DC
  Administered 2024-04-20 (×2): 237 mL via ORAL
  Filled 2024-04-19 (×5): qty 237

## 2024-04-19 MED ORDER — KETOROLAC TROMETHAMINE 15 MG/ML IJ SOLN
INTRAMUSCULAR | Status: AC
  Filled 2024-04-19: qty 1

## 2024-04-19 MED ORDER — CEFAZOLIN SODIUM-DEXTROSE 2-4 GM/100ML-% IV SOLN
2.0000 g | Freq: Four times a day (QID) | INTRAVENOUS | Status: AC
  Administered 2024-04-19 (×2): 2 g via INTRAVENOUS
  Filled 2024-04-19 (×2): qty 100

## 2024-04-19 MED ORDER — VANCOMYCIN HCL 1000 MG IV SOLR
INTRAVENOUS | Status: AC
  Filled 2024-04-19: qty 20

## 2024-04-19 MED ORDER — LACTATED RINGERS IV SOLN: INTRAVENOUS | Status: DC

## 2024-04-19 MED ORDER — HYDROMORPHONE HCL 1 MG/ML IJ SOLN
0.2500 mg | INTRAMUSCULAR | Status: DC | PRN
  Administered 2024-04-19: 0.5 mg via INTRAVENOUS
  Administered 2024-04-19: 0.25 mg via INTRAVENOUS
  Administered 2024-04-19 (×2): 0.5 mg via INTRAVENOUS

## 2024-04-19 MED ORDER — METHOCARBAMOL 500 MG PO TABS
500.0000 mg | ORAL_TABLET | Freq: Four times a day (QID) | ORAL | Status: DC
  Administered 2024-04-19 – 2024-04-20 (×6): 500 mg via ORAL
  Filled 2024-04-19 (×6): qty 1

## 2024-04-19 MED ORDER — OXYCODONE HCL 5 MG PO TABS
5.0000 mg | ORAL_TABLET | ORAL | Status: DC | PRN
  Administered 2024-04-19 – 2024-04-20 (×3): 5 mg via ORAL
  Administered 2024-04-20: 10 mg via ORAL
  Administered 2024-04-20 – 2024-04-21 (×2): 5 mg via ORAL
  Administered 2024-04-21: 10 mg via ORAL
  Filled 2024-04-19 (×3): qty 1
  Filled 2024-04-19 (×2): qty 2
  Filled 2024-04-19 (×2): qty 1

## 2024-04-19 MED ORDER — IRBESARTAN 300 MG PO TABS
300.0000 mg | ORAL_TABLET | Freq: Every day | ORAL | Status: DC
  Filled 2024-04-19 (×2): qty 1

## 2024-04-19 MED ORDER — HYDROMORPHONE HCL 1 MG/ML IJ SOLN
INTRAMUSCULAR | Status: AC
  Filled 2024-04-19: qty 1

## 2024-04-19 MED ORDER — PHENYLEPHRINE 80 MCG/ML (10ML) SYRINGE FOR IV PUSH (FOR BLOOD PRESSURE SUPPORT)
PREFILLED_SYRINGE | INTRAVENOUS | Status: DC | PRN
  Administered 2024-04-19: 160 ug via INTRAVENOUS
  Administered 2024-04-19: 80 ug via INTRAVENOUS

## 2024-04-19 MED ORDER — BUPIVACAINE-EPINEPHRINE (PF) 0.25% -1:200000 IJ SOLN
INTRAMUSCULAR | Status: AC
  Filled 2024-04-19: qty 60

## 2024-04-19 MED ORDER — ROSUVASTATIN CALCIUM 20 MG PO TABS
20.0000 mg | ORAL_TABLET | Freq: Every day | ORAL | Status: DC
  Filled 2024-04-19: qty 1

## 2024-04-19 MED ORDER — POLYETHYLENE GLYCOL 3350 17 G PO PACK
17.0000 g | PACK | Freq: Every day | ORAL | Status: DC
  Administered 2024-04-19: 17 g via ORAL
  Filled 2024-04-19: qty 1

## 2024-04-19 MED ORDER — INSULIN ASPART 100 UNIT/ML IJ SOLN: 0.0000 [IU] | Freq: Three times a day (TID) | INTRAMUSCULAR | Status: DC

## 2024-04-19 MED ORDER — FENTANYL CITRATE (PF) 250 MCG/5ML IJ SOLN
INTRAMUSCULAR | Status: AC
  Filled 2024-04-19: qty 5

## 2024-04-19 MED ORDER — KETOROLAC TROMETHAMINE 15 MG/ML IJ SOLN
15.0000 mg | Freq: Four times a day (QID) | INTRAMUSCULAR | Status: AC
  Administered 2024-04-19 – 2024-04-20 (×4): 15 mg via INTRAVENOUS
  Filled 2024-04-19 (×4): qty 1

## 2024-04-19 MED ORDER — PROPOFOL 10 MG/ML IV BOLUS
INTRAVENOUS | Status: AC
  Filled 2024-04-19: qty 20

## 2024-04-19 MED ORDER — POVIDONE-IODINE 10 % EX SWAB
2.0000 | Freq: Once | CUTANEOUS | Status: AC
  Administered 2024-04-19: 2 via TOPICAL

## 2024-04-19 MED ORDER — AMLODIPINE BESYLATE 10 MG PO TABS: 10.0000 mg | ORAL_TABLET | Freq: Every morning | ORAL | Status: DC

## 2024-04-19 MED ORDER — DAPAGLIFLOZIN PROPANEDIOL 5 MG PO TABS
5.0000 mg | ORAL_TABLET | Freq: Every day | ORAL | Status: DC
  Filled 2024-04-19 (×3): qty 1

## 2024-04-19 MED ORDER — CHLORHEXIDINE GLUCONATE 0.12 % MT SOLN: 15.0000 mL | Freq: Once | OROMUCOSAL | Status: AC

## 2024-04-19 MED ORDER — TRANEXAMIC ACID-NACL 1000-0.7 MG/100ML-% IV SOLN
1000.0000 mg | INTRAVENOUS | Status: AC
  Administered 2024-04-19: 1000 mg via INTRAVENOUS

## 2024-04-19 MED ORDER — EPHEDRINE SULFATE-NACL 50-0.9 MG/10ML-% IV SOSY
PREFILLED_SYRINGE | INTRAVENOUS | Status: DC | PRN
  Administered 2024-04-19: 5 mg via INTRAVENOUS

## 2024-04-19 MED ORDER — PANTOPRAZOLE SODIUM 40 MG PO TBEC
40.0000 mg | DELAYED_RELEASE_TABLET | Freq: Every day | ORAL | Status: DC
  Administered 2024-04-19: 40 mg via ORAL
  Filled 2024-04-19: qty 1

## 2024-04-19 MED ORDER — CHLORHEXIDINE GLUCONATE 0.12 % MT SOLN
OROMUCOSAL | Status: AC
  Administered 2024-04-19: 15 mL via OROMUCOSAL
  Filled 2024-04-19: qty 15

## 2024-04-19 SURGICAL SUPPLY — 87 items
ALCOHOL 70% 16 OZ (MISCELLANEOUS) ×1 IMPLANT
BAG COUNTER SPONGE SURGICOUNT (BAG) ×1 IMPLANT
BLADE CLIPPER SURG (BLADE) IMPLANT
BLADE SURG 10 STRL SS (BLADE) IMPLANT
BUR 14 MATCH 3 (BUR) IMPLANT
BUR MR8 14CM BALL SYMTRI 5 (BUR) IMPLANT
BUR NEURO DRILL SOFT 3.0X3.8M (BURR) ×1 IMPLANT
CANISTER SUCTION 3000ML PPV (SUCTIONS) ×1 IMPLANT
CATH FOLEY 2WAY SLVR 5CC 16FR (CATHETERS) ×1 IMPLANT
CLIP NEUROVISION LG (NEUROSURGERY SUPPLIES) IMPLANT
COVER MAYO STAND STRL (DRAPES) IMPLANT
COVER SURGICAL LIGHT HANDLE (MISCELLANEOUS) ×2 IMPLANT
DERMABOND ADVANCED .7 DNX12 (GAUZE/BANDAGES/DRESSINGS) ×1 IMPLANT
DERMABOND ADVANCED .7 DNX6 (GAUZE/BANDAGES/DRESSINGS) IMPLANT
DISSECTOR BLUNT TIP ENDO 5MM (MISCELLANEOUS) ×1 IMPLANT
DISSECTOR STICK (MISCELLANEOUS) IMPLANT
DRAIN HEMOVAC 7FR (DRAIN) IMPLANT
DRAPE C-ARM 42X72 X-RAY (DRAPES) ×2 IMPLANT
DRAPE C-ARMOR (DRAPES) ×1 IMPLANT
DRAPE INCISE IOBAN 66X45 STRL (DRAPES) ×1 IMPLANT
DRAPE SHEET LG 3/4 BI-LAMINATE (DRAPES) ×6 IMPLANT
DRAPE SURG 17X23 STRL (DRAPES) ×4 IMPLANT
DRAPE UTILITY XL STRL (DRAPES) ×3 IMPLANT
DRESSING MEPILEX FLEX 4X4 (GAUZE/BANDAGES/DRESSINGS) ×1 IMPLANT
DRSG COVADERM 4X10 (GAUZE/BANDAGES/DRESSINGS) ×2 IMPLANT
DRSG IV TEGADERM 3.5X4.5 STRL (GAUZE/BANDAGES/DRESSINGS) IMPLANT
DRSG MEPILEX BORD LITE 2X5 (GAUZE/BANDAGES/DRESSINGS) IMPLANT
DRSG TEGADERM 4X10 (GAUZE/BANDAGES/DRESSINGS) ×2 IMPLANT
DURAPREP 26ML APPLICATOR (WOUND CARE) ×2 IMPLANT
ELECT COATED BLADE 2.86 ST (ELECTRODE) ×2 IMPLANT
ELECT NVM5 SURFACE MEP/EMG (ELECTRODE) IMPLANT
ELECT PENCIL ROCKER SW 15FT (MISCELLANEOUS) ×2 IMPLANT
ELECTRODE REM PT RTRN 9FT ADLT (ELECTROSURGICAL) ×2 IMPLANT
FEE COVERAGE SUPPORT O-ARM (MISCELLANEOUS) ×1 IMPLANT
GAUZE 4X4 16PLY ~~LOC~~+RFID DBL (SPONGE) ×1 IMPLANT
GAUZE SPONGE 4X4 12PLY STRL (GAUZE/BANDAGES/DRESSINGS) ×1 IMPLANT
GLOVE BIO SURGEON STRL SZ7.5 (GLOVE) ×2 IMPLANT
GLOVE BIOGEL PI IND STRL 7.0 (GLOVE) ×1 IMPLANT
GLOVE BIOGEL PI IND STRL 7.5 (GLOVE) IMPLANT
GLOVE ECLIPSE 7.5 STRL STRAW (GLOVE) IMPLANT
GLOVE INDICATOR 7.5 STRL GRN (GLOVE) ×3 IMPLANT
GLOVE INDICATOR 8.0 STRL GRN (GLOVE) ×1 IMPLANT
GLOVE SS BIOGEL STRL SZ 7.5 (GLOVE) ×4 IMPLANT
GOWN STRL REUS W/ TWL LRG LVL3 (GOWN DISPOSABLE) ×2 IMPLANT
GOWN STRL SURGICAL XL XLNG (GOWN DISPOSABLE) ×3 IMPLANT
GRAFT BNE FBR PLIAFX PRIME 5 (Bone Implant) IMPLANT
KIT BASIN OR (CUSTOM PROCEDURE TRAY) ×2 IMPLANT
KIT DILATOR XLIF 5 (KITS) IMPLANT
KIT POSITIONER JACKSON TABLE (MISCELLANEOUS) ×1 IMPLANT
KIT SURGICAL ACCESS MAXCESS 4 (KITS) IMPLANT
KIT TURNOVER KIT B (KITS) ×2 IMPLANT
MARKER SKIN DUAL TIP RULER LAB (MISCELLANEOUS) ×2 IMPLANT
MARKER SPHERE PSV REFLC NDI (MISCELLANEOUS) ×5 IMPLANT
MODULE EMG NDL SSEP NVM5 (NEUROSURGERY SUPPLIES) IMPLANT
MODULE EMG NEEDLE SSEP NVM5 (NEUROSURGERY SUPPLIES) ×1 IMPLANT
NDL 22X1.5 STRL (OR ONLY) (MISCELLANEOUS) ×1 IMPLANT
NEEDLE 22X1.5 STRL (OR ONLY) (MISCELLANEOUS) ×1 IMPLANT
PACK LAMINECTOMY ORTHO (CUSTOM PROCEDURE TRAY) ×2 IMPLANT
PACK UNIVERSAL I (CUSTOM PROCEDURE TRAY) ×1 IMPLANT
PAD ARMBOARD POSITIONER FOAM (MISCELLANEOUS) ×2 IMPLANT
PIN BONE FIX 100 (PIN) IMPLANT
PROBE BALL TIP NVM5 SNG USE (NEUROSURGERY SUPPLIES) IMPLANT
PUTTY DBM INSTAFILL CART 5CC (Putty) IMPLANT
ROD RELINE MAS TI LORD 5.5X40 (Rod) IMPLANT
SCREW LOCK RELINE 5.5 TULIP (Screw) IMPLANT
SCREW RELINE MAS RED 5.5X45MM (Screw) IMPLANT
SCREW RELINE RED 6.5X45MM POLY (Screw) IMPLANT
SOLN 0.9% NACL POUR BTL 1000ML (IV SOLUTION) ×2 IMPLANT
SOLN STERILE WATER BTL 1000 ML (IV SOLUTION) ×2 IMPLANT
SPACER RISE-L 18X50 7-14 (Spacer) IMPLANT
SPONGE SURGIFOAM ABS GEL 100 (HEMOSTASIS) IMPLANT
SPONGE T-LAP 4X18 ~~LOC~~+RFID (SPONGE) ×2 IMPLANT
SUCTION TUBE FRAZIER 10FR DISP (SUCTIONS) ×1 IMPLANT
SUT BONE WAX W31G (SUTURE) ×1 IMPLANT
SUT MNCRL AB 3-0 PS2 27 (SUTURE) ×2 IMPLANT
SUT VIC AB 0 CT1 18XCR BRD8 (SUTURE) ×3 IMPLANT
SUT VIC AB 2-0 CP2 18 (SUTURE) IMPLANT
SUT VIC AB 2-0 CT1 18 (SUTURE) ×3 IMPLANT
SUT VIC AB 2-0 CT2 18 VCP726D (SUTURE) ×2 IMPLANT
SUT VIC AB 3-0 PS2 18XBRD (SUTURE) ×1 IMPLANT
SYR BULB IRRIG 60ML STRL (SYRINGE) ×1 IMPLANT
SYR CONTROL 10ML LL (SYRINGE) ×1 IMPLANT
TAPE CLOTH 4X10 WHT NS (GAUZE/BANDAGES/DRESSINGS) ×2 IMPLANT
TIP CONICAL INSTAFILL (ORTHOPEDIC DISPOSABLE SUPPLIES) IMPLANT
TOWEL GREEN STERILE (TOWEL DISPOSABLE) ×2 IMPLANT
TOWEL GREEN STERILE FF (TOWEL DISPOSABLE) ×2 IMPLANT
TUBING FEATHERFLOW (TUBING) ×1 IMPLANT

## 2024-04-19 NOTE — Transfer of Care (Signed)
 Immediate Anesthesia Transfer of Care Note  Patient: Brooke Villa  Procedure(s) Performed: ANTERIOR LATERAL LUMBAR FUSION 1 LEVEL POSTERIOR LUMBAR FUSION 1 LEVEL APPLICATION OF O-ARM  Patient Location: PACU  Anesthesia Type:General  Level of Consciousness: drowsy  Airway & Oxygen Therapy: Patient Spontanous Breathing and Patient connected to face mask oxygen  Post-op Assessment: Report given to RN and Post -op Vital signs reviewed and stable  Post vital signs: Reviewed and stable  Last Vitals:  Vitals Value Taken Time  BP 122/61 04/19/24 12:38  Temp    Pulse 71 04/19/24 12:43  Resp 16 04/19/24 12:44  SpO2 96 % 04/19/24 12:43  Vitals shown include unfiled device data.  Last Pain:  Vitals:   04/19/24 0638  TempSrc:   PainSc: 0-No pain      Patients Stated Pain Goal: 1 (04/19/24 0631)  Complications: No notable events documented.

## 2024-04-19 NOTE — Anesthesia Procedure Notes (Signed)
 Procedure Name: Intubation Date/Time: 04/19/2024 7:52 AM  Performed by: Gianny Killman C, CRNAPre-anesthesia Checklist: Patient identified, Emergency Drugs available, Suction available and Patient being monitored Patient Re-evaluated:Patient Re-evaluated prior to induction Oxygen Delivery Method: Circle system utilized Preoxygenation: Pre-oxygenation with 100% oxygen Induction Type: IV induction Ventilation: Mask ventilation without difficulty Laryngoscope Size: Mac and 3 Grade View: Grade I Tube type: Oral Tube size: 7.0 mm Number of attempts: 1 Airway Equipment and Method: Stylet and Oral airway Placement Confirmation: ETT inserted through vocal cords under direct vision, positive ETCO2 and breath sounds checked- equal and bilateral Secured at: 21 cm Tube secured with: Tape Dental Injury: Teeth and Oropharynx as per pre-operative assessment

## 2024-04-19 NOTE — H&P (Signed)
 Orthopedic Spine Surgery H&P Note  Assessment: Patient is a 78 y.o. female with low back pain that radiates into bilateral lower extremities consistent with neurogenic claudication   Plan: -Out of bed as tolerated, activity as tolerated, no brace -Covered the risks of surgery one more time with the patient and patient elected to proceed with planned surgery -Written consent verified -Hold anticoagulation in anticipation of surgery -Ancef  and TXA on all to OR -NPO for procedure -Site marked -To OR when ready  The risks covered this morning included but were not limited to: dural tear, nerve root injury, paralysis, pseudarthrosis, persistent pain, infection, bleeding, hardware failure, adjacent segment disease, vascular injury, psoas weakness, lumbar plexus injury, heart attack, death, fracture, and need for additional procedures.  ___________________________________________________________________________  Chief Complaint: low back pain that radiates into bilateral lower extremities  History: Patient is 78 y.o. female who has been previously seen in the office for low back pain that radiates into bilateral lower extremities.  Workup was consistent with neurogenic claudication..  Her symptoms failed to improve with conservative treatment so operative management was discussed at the last office visit. The patient presents today with no changes in her symptoms since the last office visit. See previous office note for further details.    Review of systems: General: denies fevers and chills, myalgias Neurologic: denies recent changes in vision, slurred speech Abdomen: denies nausea, vomiting, hematemesis Respiratory: denies cough, shortness of breath  Past medical history: DM (last A1c was 5.9 on 04/14/2024) GERD HLD HTN   Allergies: NKDA   Past surgical history:  D&C of uterus Left partial knee replacement Tubal ligation Cataract surgery   Social history: Denies use of nicotine  product (smoking, vaping, patches, smokeless) Alcohol use: denies Denies recreational drug use  Family history: -reviewed and not pertinent to lumbar stenosis with neurogenic claudication   Physical Exam:  BMI of 35.4  General: no acute distress, appears stated age Neurologic: alert, answering questions appropriately, following commands Cardiovascular: regular rate, no cyanosis Respiratory: unlabored breathing on room air, symmetric chest rise Psychiatric: appropriate affect, normal cadence to speech   MSK (spine):  -Strength exam      Left  Right  EHL    5/5  5/5 TA    5/5  5/5 GSC    5/5  5/5 Knee extension  5/5  5/5 Knee flexion   5/5  5/5 Hip flexion   5/5  5/5  -Sensory exam    Sensation intact to light touch in L3-S1 nerve distributions of bilateral lower extremities   Patient name: Brooke Villa Patient MRN: [id number] Date: 04/19/24

## 2024-04-19 NOTE — Progress Notes (Signed)
 Patient seen in PACU.  Still drowsy from anesthesia.  Having pain in her back.  5/5 strength with TA, GSC, EHL bilaterally.  Sensation intact to light touch in L3-S1 nerve distributions bilaterally.  Feet warm and well-perfused.  Plan for patient to go to the floor from PACU.  She will get 2 more doses of Ancef .  She get another dose of TXA.  She will be out of bed as tolerated without a brace.  PT will work with her starting tomorrow.  Will work on pain control as she is having significant pain at this point.  Ozell DELENA Ada, MD Orthopedic Surgeon

## 2024-04-19 NOTE — Brief Op Note (Signed)
 04/19/2024  12:51 PM  PATIENT:  Brooke Villa  78 y.o. female  PRE-OPERATIVE DIAGNOSIS:  LUMBAR STENOSIS WITH NEUROGENIC CLAUDICATION  POST-OPERATIVE DIAGNOSIS:  LUMBAR STENOSIS WITH NEUROGENIC CLAUDICATION  PROCEDURE:  Procedure(s) with comments: ANTERIOR LATERAL LUMBAR FUSION 1 LEVEL (N/A) - L4-5 EXTREME LATERAL INTERBODY FUSION AND L4-5 POSTERIOR INSTRUMENTED SPINAL FUSION POSTERIOR LUMBAR FUSION 1 LEVEL (N/A) APPLICATION OF O-ARM (N/A)  SURGEON:  Surgeons and Role:    * Georgina Ozell LABOR, MD - Primary  PHYSICIAN ASSISTANT:   ASSISTANTS: none   ANESTHESIA:   general  EBL:  200 mL   BLOOD ADMINISTERED:none  DRAINS: none   LOCAL MEDICATIONS USED:  MARCAINE      SPECIMEN:  No Specimen  DISPOSITION OF SPECIMEN:  N/A  COUNTS:  YES  TOURNIQUET:  NONE  DICTATION: .Note written in EPIC  PLAN OF CARE: Admit to inpatient   PATIENT DISPOSITION:  PACU - hemodynamically stable.   Delay start of Pharmacological VTE agent (>24hrs) due to surgical blood loss or risk of bleeding: yes

## 2024-04-19 NOTE — Anesthesia Postprocedure Evaluation (Signed)
 Anesthesia Post Note  Patient: Brooke Villa  Procedure(s) Performed: ANTERIOR LATERAL LUMBAR FUSION 1 LEVEL POSTERIOR LUMBAR FUSION 1 LEVEL APPLICATION OF O-ARM     Patient location during evaluation: PACU Anesthesia Type: General Level of consciousness: awake Pain management: pain level controlled Vital Signs Assessment: post-procedure vital signs reviewed and stable Respiratory status: spontaneous breathing, nonlabored ventilation and respiratory function stable Cardiovascular status: blood pressure returned to baseline and stable Postop Assessment: no apparent nausea or vomiting Anesthetic complications: no   No notable events documented.  Last Vitals:  Vitals:   04/19/24 1530 04/19/24 1558  BP: 136/60 139/68  Pulse: (!) 59 63  Resp: 12 14  Temp:  36.6 C  SpO2: 99% 100%    Last Pain:  Vitals:   04/19/24 1500  TempSrc:   PainSc: Asleep                 Delon Aisha Arch

## 2024-04-20 ENCOUNTER — Inpatient Hospital Stay (HOSPITAL_COMMUNITY)

## 2024-04-20 ENCOUNTER — Other Ambulatory Visit (HOSPITAL_COMMUNITY): Payer: Self-pay

## 2024-04-20 DIAGNOSIS — M4316 Spondylolisthesis, lumbar region: Secondary | ICD-10-CM

## 2024-04-20 DIAGNOSIS — M48062 Spinal stenosis, lumbar region with neurogenic claudication: Secondary | ICD-10-CM

## 2024-04-20 LAB — GLUCOSE, CAPILLARY
Glucose-Capillary: 104 mg/dL — ABNORMAL HIGH (ref 70–99)
Glucose-Capillary: 108 mg/dL — ABNORMAL HIGH (ref 70–99)
Glucose-Capillary: 120 mg/dL — ABNORMAL HIGH (ref 70–99)
Glucose-Capillary: 119 mg/dL — ABNORMAL HIGH (ref 70–99)

## 2024-04-20 LAB — BASIC METABOLIC PANEL WITH GFR
Anion gap: 13 (ref 5–15)
BUN: 18 mg/dL (ref 8–23)
CO2: 23 mmol/L (ref 22–32)
Calcium: 8.9 mg/dL (ref 8.9–10.3)
Chloride: 102 mmol/L (ref 98–111)
Creatinine, Ser: 1.32 mg/dL — ABNORMAL HIGH (ref 0.44–1.00)
GFR, Estimated: 41 mL/min — ABNORMAL LOW (ref >60)
Glucose, Bld: 115 mg/dL — ABNORMAL HIGH (ref 70–99)
Potassium: 4.4 mmol/L (ref 3.5–5.1)
Sodium: 138 mmol/L (ref 135–145)

## 2024-04-20 LAB — CBC
HCT: 30.3 % — ABNORMAL LOW (ref 36.0–46.0)
Hemoglobin: 9.7 g/dL — ABNORMAL LOW (ref 12.0–15.0)
MCH: 25.3 pg — ABNORMAL LOW (ref 26.0–34.0)
MCHC: 32 g/dL (ref 30.0–36.0)
MCV: 78.9 fL — ABNORMAL LOW (ref 80.0–100.0)
Platelets: 135 K/uL — ABNORMAL LOW (ref 150–400)
RBC: 3.84 MIL/uL — ABNORMAL LOW (ref 3.87–5.11)
RDW: 15 % (ref 11.5–15.5)
WBC: 9.6 K/uL (ref 4.0–10.5)
nRBC: 0 % (ref 0.0–0.2)

## 2024-04-20 MED ORDER — OXYCODONE HCL 5 MG PO TABS
5.0000 mg | ORAL_TABLET | ORAL | 0 refills | Status: AC | PRN
  Filled 2024-04-20: qty 30, 7d supply, fill #0

## 2024-04-20 MED ORDER — POLYETHYLENE GLYCOL 3350 17 GM/SCOOP PO POWD
17.0000 g | Freq: Every day | ORAL | 0 refills | Status: AC
  Filled 2024-04-20: qty 238, 14d supply, fill #0

## 2024-04-20 MED ORDER — METHOCARBAMOL 500 MG PO TABS
500.0000 mg | ORAL_TABLET | Freq: Four times a day (QID) | ORAL | 0 refills | Status: AC
  Filled 2024-04-20: qty 40, 10d supply, fill #0

## 2024-04-20 MED ORDER — ACETAMINOPHEN 500 MG PO TABS
1000.0000 mg | ORAL_TABLET | Freq: Three times a day (TID) | ORAL | 0 refills | Status: AC
  Filled 2024-04-20: qty 84, 14d supply, fill #0

## 2024-04-20 MED ORDER — SENNA 8.6 MG PO TABS
1.0000 | ORAL_TABLET | Freq: Two times a day (BID) | ORAL | 0 refills | Status: AC
  Filled 2024-04-20: qty 28, 14d supply, fill #0

## 2024-04-20 NOTE — Discharge Summary (Signed)
 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 female who was admitted after undergoing L4/5 lateral interbody fusion and L4/5 posterior instrumented spinal fusion. The patient had significant pain immediately after surgery, but pain eventually was controlled with a multimodal regimen including oxycodone . Labs during the hospitalization revealed acute anemia from surgical blood loss. She was asymptomatic and there was no further blood loss expected so no intervention was performed. There were no significant electrolyte abnormalities post-operatively. The patient worked with physical therapy who recommended discharge to home. The patient was tolerating an oral diet without issue and was voiding spontaneously after surgery. The patient's vitals were stable on the day of discharge. The patient was medically ready for discharge and was discharge to home on post-operative day two.  Instructions:   Orthopedic Surgery Discharge Instructions  Patient name: Brooke Villa Procedure Performed: L4/5 lateral interbody fusion, L4/5 posterior instrumented spinal fusion Date of Surgery: 04/19/2024 Surgeon: Ozell Ada, MD  Pre-operative Diagnosis: lumbar stenosis with neurogenic claudication, L4/5 spondylolisthesis Post-operative Diagnosis: same as above   Discharged to: home Discharge Condition: stable  Activity: You should refrain from bending, lifting, or twisting with objects greater than ten pounds until three months after surgery. You are encouraged to walk as much as desired. You can perform household activities such as cleaning dishes, doing laundry, vacuuming, etc. as long as the ten-pound restriction is  followed. You do not need to wear a brace during the post-operative period.   Incision Care: Your incisions have dressings over them. Those dressings should remain in place and dry at all times for a total of one week after surgery. After one week, you can remove the dressings. Underneath the dressings, you will find skin glue. You should leave the skin glue in place. It will fall off with time. Do not pick, rub, or scrub at it. Do not put cream or lotion over the surgical area. After one week and once the dressing is off, it is okay to let soap and water run over your incisions. Again, do not pick, scrub, or rub at the skin glue when bathing. Do not submerge (e.g., take a bath, swim, go in a hot tub, etc.) until six weeks after surgery. There may be some bloody drainage from the incision into the dressing after surgery. This is normal. You do not need to replace the dressings. Continue to leave it in place for the one week as instructed above. Should the dressing become saturated with blood or drainage, please call the office for further instructions.   Medications: You have been prescribed oxycodone . This is a narcotic pain medication and should only be taken as prescribed. You should not drink alcohol or operate heavy machinery (including driving) while taking this medication. The oxycodone  can cause constipation as a side effect. For that reason, you have been prescribed senna and miralax . These are both laxatives. You do not need to take this medication if you develop diarrhea. Should you remain constipated even while taking these medications, please increase the dose of miralax  to twice daily. Tylenol  has been prescribed to be taken every 8 hours, which will give you additional pain relief. Robaxin  is a muscle relaxer that has been prescribed to you for muscle spasm type pain. Take this medication as needed.   Do  not take NSAIDs (ibuprofen, Aleve, Celebrex, naproxen, meloxicam , etc.) for the first 6  weeks after surgery as there is some evidence that their use may decrease the chances of successful fusion.   In order to set expectations for opioid prescriptions, you will only be prescribed opioids for a total of six weeks after surgery and, at two-weeks after surgery, your opioid prescription will start to tapered (decreased dosage and number of pills). If you have ongoing need for opioid medication six weeks after surgery, you will be referred to pain management. If you are already established with a provider that is giving you opioid medications, you should schedule an appointment with them for six weeks after surgery if you feel you are going to need another prescription. State law only allows for opioid prescriptions one week at a time. If you are running out of opioid medication near the end of the week, please call the office during business hours before running out so I can send you another prescription.   You may resume any home blood thinners (warfarin, lovenox , apixaban, plavix, xarelto , etc) 72 hours after your surgery. Take these medications as they were previously prescribed.   Driving: You should not drive while taking narcotic pain medications. You should start getting back to driving slowly and you may want to try driving in a parking lot before doing anything more.   Diet: You are safe to resume your regular diet after surgery.   Reasons to Call the Office After Surgery: You should feel free to call the office with any concerns or questions you have in the post-operative period, but you should definitely notify the office if you develop: -shortness of breath, chest pain, or trouble breathing -excessive bleeding, drainage, redness, or swelling around the surgical site -fevers, chills, or pain that is getting worse with each passing day -persistent nausea or vomiting -new weakness in either leg -new or worsening numbness or tingling in either leg -numbness in the groin, bowel or  bladder incontinence -other concerns about your surgery  Follow Up Appointments: You should have an office appointment scheduled for approximately two weeks after surgery. If you do not remember when this appointment is or do not already have it scheduled, please call the office to schedule.   Office Information:  -Ozell Ada, MD -Phone number: [phone] -Address: [address]       [place], [place] [address]

## 2024-04-20 NOTE — Evaluation (Signed)
 Physical Therapy Brief Evaluation and Discharge Note Patient Details Name: Brooke Villa MRN: [id number] DOB: [birth year] Today's Date: 04/20/2024   History of Present Illness  Patient is a 78 y.o. female who is currently admitted after undergoing L4/5 XLIF and PSIF. PMH includes DM, GERD, HTN, HLD, L partial knee replacement, cataract surgery.  Clinical Impression  Patient evaluated by Physical Therapy with no further acute PT needs identified. Pt reports 4/10 radicular pain in LLE. Pt able demonstrate correct log roll technique and ambulating 400 ft with a RW modI. Negotiated 2 steps with rail vs no rail to simulate home set up; recommended supervision/assist for stair negotiation and RW use for all mobility in setting of LLE proximal weakness. Pt recalling 3/3 spinal precautions and reviewed activity recommendations and car transfer technique. All education has been completed and the patient has no further questions. See below for any follow-up Physical Therapy or equipment needs. PT is signing off. Thank you for this referral.      PT Assessment Patient does not need any further PT services  Assistance Needed at Discharge  PRN    Equipment Recommendations Rolling walker (2 wheels)  Recommendations for Other Services       Precautions/Restrictions Precautions Precautions: Back Precaution Booklet Issued: Yes (comment) Recall of Precautions/Restrictions: Intact Precaution/Restrictions Comments: educated on spinal prec Other Brace: no brace needed per MD Restrictions Weight Bearing Restrictions Per Provider Order: No        Mobility  Bed Mobility Rolling: Modified independent (Device/Increase time) Supine/Sidelying to sit: Modified independent (Device/Increased time)   General bed mobility comments: HOB flat, no rails  Transfers Overall transfer level: Modified independent Equipment used: Rolling walker (2 wheels)                     Ambulation/Gait Ambulation/Gait assistance: Modified independent (Device/Increase time) Gait Distance (Feet): 400 Feet Assistive device: Rolling walker (2 wheels) Gait Pattern/deviations: Step-through pattern   General Gait Details: Verbal cues for shorter R step length  Home Activity Instructions    Stairs Stairs: Yes Stairs assistance: Contact guard assist Stair Management: With walker, No rails, One rail Right Number of Stairs: 2 General stair comments: Verbal cues for sequencing, using folded up RW as simulated rail for UE support  Modified Rankin (Stroke Patients Only)        Balance Overall balance assessment: Mild deficits observed, not formally tested                        Pertinent Vitals/Pain PT - Brief Vital Signs All Vital Signs Stable: Yes Pain Assessment Pain Assessment: 0-10 Pain Score: 4  Faces Pain Scale: Hurts little more Pain Location: LLE Pain Descriptors / Indicators: Discomfort Pain Intervention(s): Monitored during session     Home Living Family/patient expects to be discharged to:: Private residence Living Arrangements: Spouse/significant other Available Help at Discharge: Family Home Environment: Stairs to enter  [place] of Steps: 2 Home Equipment: Cane - single point;Shower seat;Adaptive equipment Adaptive Equipment: Reacher      Prior Function Level of Independence: Independent with assistive device(s) Comments: SPC    UE/LE Assessment   UE ROM/Strength/Tone/Coordination: WFL    LE ROM/Strength/Tone/Coordination: Impaired LE ROM/Strength/Tone/Coordination Deficits: Unable to perform L hip flexion due to pain, knee ext/ankle DF 5/5    Communication   Communication Communication: No apparent difficulties     Cognition Overall Cognitive Status: Appears within functional limits for tasks assessed/performed       General Comments General  comments (skin integrity, edema, etc.): VSS    Exercises      Assessment/Plan    PT Problem List         PT Visit Diagnosis Pain;Difficulty in walking, not elsewhere classified (R26.2)    No Skilled PT All education completed;Patient is modified independent with all activity/mobility   Co-evaluation                AMPAC 6 Clicks Help needed turning from your back to your side while in a flat bed without using bedrails?: None Help needed moving from lying on your back to sitting on the side of a flat bed without using bedrails?: None Help needed moving to and from a bed to a chair (including a wheelchair)?: None Help needed standing up from a chair using your arms (e.g., wheelchair or bedside chair)?: None Help needed to walk in hospital room?: None Help needed climbing 3-5 steps with a railing? : A Little 6 Click Score: 23      End of Session Equipment Utilized During Treatment: Gait belt Activity Tolerance: Patient tolerated treatment well Patient left: in bed;with call bell/phone within reach Nurse Communication: Mobility status PT Visit Diagnosis: Pain;Difficulty in walking, not elsewhere classified (R26.2) Pain - Right/Left: Left Pain - part of body: Leg     Time: 9084-9068 PT Time Calculation (min) (ACUTE ONLY): 16 min  Charges:   PT Evaluation $PT Eval Low Complexity: 1 Low      Aleck Daring, PT, DPT Acute Rehabilitation Services Office [phone]   Aleck ONEIDA Daring  04/20/2024, 9:39 AM

## 2024-04-20 NOTE — Op Note (Addendum)
 Orthopedic Spine Surgery Operative Report  Procedure: L4/5 lateral lumbar interbody arthrodesis  Insertion of biomechanical interbody device at L4/5 Application of demineralized bone matrix  Modifier: none  Date of procedure: 04/19/2024  Patient name: Brooke Villa MRN: [id number] DOB: 06/17/1945  Surgeon: Ozell Ada, MD Assistant: none Pre-operative diagnosis: lumbar stenosis with neurogenic claudication, L4/5 spondylolisthesis Post-operative diagnosis: same as above Findings: L4/5 spondylolisthesis, L4/5 disc degeneration  Specimens: none Anesthesia: general EBL: 50cc Complications: none Pre-incision antibiotic: ancef  TXA was given prior to incision as well  Implants:  Rise-L Interbody spacer ([id number]) Demineralized bone matrix   Indication for procedure: Patient is a 78 y.o. female who presented to the office with symptoms consistent with lumbar stenosis and neurogenic claudication. The patient had tried conservative treatments that did not provide any lasting relief. As result, operative management was discussed as an option. As a part of her surgical plan, lateral lumbar interbody arthrodesis at L4/5 was discussed. The risks including but not limited to dural tear, nerve root injury, paralysis, pseudarthrosis, persistent pain, infection, bleeding, hardware failure, adjacent segment disease, vascular injury, psoas weakness, lumbar plexus injury, heart attack, death, stroke, fracture, and need for additional procedures were discussed with the patient. Since this will be an indirect decompression, there is a risk of persistent symptoms which the patient understood after explaining the reason. The benefit of the surgery would be improvement in the patient's radiating leg pain. The alternatives to surgical management were covered with the patient and included continued monitoring, physical therapy, over-the-counter pain medications, ambulatory aids, and activity modification. All the  patient's questions were answered to her satisfaction. After this discussion, the patient expressed understanding and elected to proceed with surgical intervention.  Procedure Description: The patient was met in the pre-operative holding area. The patient's identity and consent were verified. The operative site was marked. The patient's remaining questions about the surgery were answered. The patient was brought back to the operating room. General anesthesia was induced and an endotracheal tube was placed by the anesthesia staff. Neuromonitoring leads were attached to the patient by the neuromonitoring technologist. The patient was transferred to the flat top Proaxis table in the lateral position. All bony prominences were well padded. An axillary roll was placed in the right axilla. The surgical area was cleansed with alcohol. Baseline neuromonitoring signals were obtained. Fluoroscopy was then brought in to check rotation on the AP image and to mark the anterior, posterior 1/3, and posterior aspects of the disc space on the lateral image. The patient's skin was then prepped and draped in a standard, sterile fashion. A time out was performed that identified the patient, the procedure, and the operative levels. All team members agreed with what was stated in the time out.  A transverse incision over the abdominal wall was made over the previously marked disc space through the skin and dermis. Electrocautery was used to continue the dissection through the subcutaneous tissue and fat to the level of the fascia overlying the external oblique muscle. Electrocautery was used to make an incision through the fascia in line with the muscle fibers. Metzenbaum scissors were used to dissect in line with the muscle fibers of the external oblique, the internal oblique, and then the transversus abdominis muscles. Metzenbaum scissors were used to open the transversalis fascia. A sponge stick and long kidner were utilized to  sweep the retroperitoneal fat ventral to eventually visualize the psoas muscle.   The initial dilator was placed on the psoas fibers. An AP and lateral fluoroscopic  image was taken to confirm dilator was at correct level and in proper position. The dilator was stimulated and readings were over 20 in all directions. The dilator was then rotated to bluntly advance it through the psoas muscle. The position of the dilator was checked and adjusted under fluoroscopy until it was in the proper position near the posterior 1/3 of the disc space. The dilator was again stimulated and all readings were over 20 in all directions. A K wire was advanced through the dilator into the disc space to secure the dilator's position. Sequential dilators were placed over the initial dilator and rotated to bluntly dissect to the level of the disc space. Each time a new dilator was placed, it was stimulated in all directions and no readings were under 18. The lowest reading (18) was obtained with the last dilator when it was pointed posteriorly. The retractor was then advanced over the dilators to the level of the disc space. Fluoroscopy was used to adjust and align the retractor to be parallel to the disc space. The retractor was locked into place using the bed attachment. The position of the retractor was checked again on AP and lateral fluoroscopy. The retractor was in a satisfactory position. The stimulator probe was used to test in all directors, including under the posterior retractor blade, and no reading was under 17. That 17 reading came under the posterior blade of the retractor, all other readings were over 20. The shim was placed and advanced down the retractor into the disc space. The dilators and K wire were removed. The cranial/caudal aspect of the retractor was opened and the anterior blade of the retractor was advanced ventrally by six clicks. A bipolar and long kidner were used to remove the remaining soft tissue off of  the disc space. A rectangular annulotomy was made in the disc space. A pituitary was used to remove some of the disc. A Cobb was placed into the disc space and advanced along the endplate to clear cartilaginous and disc material off of the endplate under fluoroscopic guidance. The Cobb was impacted to advance it through the annulus on the contralateral side. The Cobb was rotated and the other end plate was prepared in the same fashion. More disc was removed with a pituitary. Then, a combination of ringed curettes, straight curettes, curved curettes, and pituitaries were used to remove the remaining disc until the endplates were well prepared with cartilaginous and disc material removed off of them. A trial was placed across the disc space and the width of the final implant was estimated off the trial. Neuromonitoring signals were checked with the trial in and were unchanged from baseline. An expandable [id number] biomechanical interbody device was selected for final implantation. Demineralized bone matrix was packed into interbody device. The interbody device was placed into the disc space and advanced under fluoroscopic guidance until it was across the disc space. AP and lateral fluoroscopic images were taken and confirmed satisfactory position of the implant. Neuromonitoring signals were checked and were unchanged from baseline. The biomechanical interbody device was then expanded and periodic fluoroscopic images were obtained. The cage was expanded to a height of 10mm and 10.5 degrees. The inserter was removed off the cage and the cage was backfilled with demineralized bone matrix until it was full of demineralized bone matrix. The retractor blades were then closed and was then slowly removed and bleeding was coagulated with a bipolar. Final AP and lateral fluoroscopic images were taken and confirmed satisfactory position  of the implant.   The transversalis fascia was reapproximated with 0 vicryl. The internal and  external oblique muscular layers were reapproximated with 0 vicryl. The fascia overlying the external oblique muscle was reapproximated with 0 vicryl. Neuromonitoring signals were again checked and there was no change from baseline. The deep dermal layer was reapproximated with 2-0 vicryl. The skin was closed with 3-0 monocryl. The incision was dressed with dermabond and an island dressing.   Post-operative plan: The patient remained intubated and in the operating room after this procedure as there was plan for further posterior surgery. The drapes will be taken down and the patient will be repositioned into the prone position. The posterior portion of the case was completed in the same trip to the operating room and will be dictated separately.   Ozell Ada, MD Orthopedic Surgeon

## 2024-04-20 NOTE — Discharge Instructions (Addendum)
 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 You are encouraged to walk as much as desired. You can perform household activities such as cleaning dishes, doing laundry, vacuuming, etc. as long as the ten-pound restriction is followed. You do not need to wear a brace during the post-operative period.   Incision Care: Your incisions have dressings over them. Those dressings should remain in place and dry at all times for a total of one week after surgery. After one week, you can remove the dressings. Underneath the dressings, you will find skin glue. You should leave the skin glue in place. It will fall off with time. Do not pick, rub, or scrub at it. Do not put cream or lotion over the surgical area. After one week and once the dressing is off, it is okay to let soap and water run over your incisions. Again, do not pick, scrub, or rub at the skin glue when bathing. Do not submerge (e.g., take a bath, swim, go in a hot tub, etc.) until six weeks after surgery. There may be some bloody drainage from the incision into the dressing after surgery. This is normal. You do not need to replace the dressings. Continue to leave it in place for the one week as instructed above. Should the dressing become saturated with blood or drainage, please call the office for further instructions.   Medications: You have been prescribed oxycodone . This is a narcotic pain medication and should only be taken as prescribed. You should not  drink alcohol or operate heavy machinery (including driving) while taking this medication. The oxycodone  can cause constipation as a side effect. For that reason, you have been prescribed senna and miralax . These are both laxatives. You do not need to take this medication if you develop diarrhea. Should you remain constipated even while taking these medications, please increase the dose of miralax  to twice daily. Tylenol  has been prescribed to be taken every 8 hours, which will give you additional pain relief. Robaxin  is a muscle relaxer that has been prescribed to you for muscle spasm type pain. Take this medication as needed.   Do not take NSAIDs (ibuprofen, Aleve, Celebrex, naproxen, meloxicam , etc.) for the first 6 weeks after surgery as there is some evidence that their use may decrease the chances of successful fusion.   In order to set expectations for opioid prescriptions, you will only be prescribed opioids for a total of six weeks after surgery and, at two-weeks after surgery, your opioid prescription will start to tapered (decreased dosage and number of pills). If you have ongoing need for opioid medication six weeks after surgery, you will be referred to pain management. If you are already established with a provider that is giving you opioid medications, you should schedule an appointment with them for six weeks after surgery if you feel you are going to need another prescription. State law only allows for opioid prescriptions one week at a time. If you are running out of opioid medication near the end of the week, please call the office during business hours before running out so I can send you another prescription.  You may resume any home blood thinners (warfarin, lovenox , apixaban, plavix, xarelto , etc) 72 hours after your surgery. Take these medications as they were previously prescribed.   Driving: You should not drive while taking narcotic pain medications. You should start getting back  to driving slowly and you may want to try driving in a parking lot before doing anything more.   Diet: You are safe to resume your regular diet after surgery.   Reasons to Call the Office After Surgery: You should feel free to call the office with any concerns or questions you have in the post-operative period, but you should definitely notify the office if you develop: -shortness of breath, chest pain, or trouble breathing -excessive bleeding, drainage, redness, or swelling around the surgical site -fevers, chills, or pain that is getting worse with each passing day -persistent nausea or vomiting -new weakness in either leg -new or worsening numbness or tingling in either leg -numbness in the groin, bowel or bladder incontinence -other concerns about your surgery  Follow Up Appointments: You should have an office appointment scheduled for approximately two weeks after surgery. If you do not remember when this appointment is or do not already have it scheduled, please call the office to schedule.   Office Information:  -Ozell Ada, MD -Phone number: [phone] -Address: [address]       [place], [place] [address]

## 2024-04-20 NOTE — Op Note (Addendum)
 Orthopedic Spine Surgery Operative Report   Procedure: L4/5 posterior lumbar spinal arthrodesis L4, L5 pedicle screw instrumentation with rods Intraoperative use of stereotactic computer-assisted navigation Application of demineralized bone matrix   Modifier: none   Date of procedure: 03/04/2024   Patient name: Brooke Villa MRN: [id number] DOB: Feb 15, 1946   Surgeon: Ozell Ada, MD Assistant: none Pre-operative diagnosis: lumbar stenosis with neurogenic claudication, L4/5 spondylolisthesis Post-operative diagnosis: same as above Findings: L4/5 spondylolisthesis   Specimens: none Anesthesia: general EBL: 150cc Complications: none Both ancef  and TXA had been given prior to the anterior portion of the procedure and were not scheduled to be redosed   Implants:  Nuvasive Reline 5.5x51mm screws (x2) Nuvasive Reline 6.5x56mm screws (x2) Nuvasive 5.5x42mm titanium rods (x2) Nuvasive set caps (x4) Demineralized bone matrix    Indication for procedure: Patient is a 78 y.o. female who who had been seen in the office for symptoms consistent with lumbar stenosis and neurogenic claudication.  The patient had tried conservative treatments but did not notice any lasting relief with those treatments.  As a result, operative management was discussed with the patient.  A L4/5 lumbar interbody fusion with posterior instrumented spinal fusion was discussed with the patient as a treatment option.  The risks, benefits, alternatives of the proposed procedure were covered with the patient.  After this conversation, the patient elected to proceed with surgery.    Procedure Description: At this point, the patient was already in the operating room.  The surgical site had already been marked, the patient's consent had been verified, and the patient's identity had been confirmed.  The patient was intubated and anesthesia had already been induced.  Patient was moved from the operating table to a stretcher.  The patient was then repositioned prone on the pro axis table.  All bony prominences were well-padded.  The head of the bed was slightly elevated and the eyes were free from compression by the face pillow. The surgical area was cleansed with alcohol. The patient's skin was then prepped and draped in a standard, sterile fashion. A time out was performed that identified the patient, the procedure, and the operative levels. All team members agreed with what was stated in the time out.    A stab incision was made over the right PSIS.  This incision was taken sharply down through skin and dermis and fascia.  A percutaneous pin was then impacted into place in the PSIS and ilium.  A reference array was attached to the percutaneous pin.  Drapes were applied around the patient.  A green towel was placed over the reference array.  The O-arm was brought in and closed.  The green towel was removed off of the reference array.  An intraoperative CT scan was obtained.  The additional drapes that were placed to cover the patient while the O-arm was in place were then were removed.   The pedicle screws were placed next. The same steps were repeated for each of the pedicle screws.  See the steps and the below paragraph for pedicle screw placement.   The navigational probe was placed over the skin in the area of the pedicle.  Navigation was used to find the skin overlying the pedicle start point.  A stab incision was made and carried sharply down through the skin, dermis, and fascia where the pedicle start point was found with the navigational probe.  The navigated bur was then placed into the wound and at the pedicle start point.  The bur was  advanced into the pedicle under navigational guidance.  A tap was then placed into the wound and into the previously created track with the bur.  The tap was then advanced into the pedicle and vertebral body under navigational guidance.  The navigational probe was used to palpate the hole  created with the awl.  No breaches were felt within the pedicle.  A 45 mm screw was then inserted under navigational guidance into the pedicle until there was good purchase.  As stated above, this process was repeated for each of the L4 and L5 pedicle screws placed. Pedicle screws were placed on both sides at L4 and L5. At L4 the pedicle screws were 5.5x91mm and at L5 they were 6.5x9mm. The tap used at L4 was a 4.64mm tap and at L5 a 5.61mm tap was used.    Once the screws were placed, the navigational bur was used to decorticate the L4/5 facet joints.  The bur was placed into the previously made incision for the L5 pedicle screw.  It was then placed at the L4/5 facet joint under navigational guidance.  The bur was then used under navigational guidance to decorticate the superior and inferior facets at L4/5.  The same process was repeated for the contralateral side to decorticate the L4/5 facet joints.   Next, rods were placed.  A caliper was placed into the most cranial and most caudal screws on each side and was used to estimate the rod length for both sides. The rods were inserted through the previously made wounds and placed into the L4 and L5 pedicle screws.  The same size 5.5x56mm rods were used on both sides.  Once the rods were contained within the pedicle screws, set caps were advanced over the pedicle screws to secure the rods in place. AP and lateral fluoroscopic images confirm satisfactory position of the screws and rods.  The set caps on all the pedicle screws were then final tightened into place. The tabs on the percutaneous pedicle screws were broken off and removed.  The reference array was removed from the percutaneous pin.  The percutaneous pin was back slapped out of the PSIS and ilium.   Final AP and lateral fluoroscopic films were obtained which confirmed satisfactory position of all implants.  Pliafix demineralized bone matrix was then packed into the area of the decorticated L4/5 facet  joints bilaterally.  30 cc of Marcaine  with epinephrine  was injected into the wounds.  Vancomycin powder was placed into all of the wounds.  The fascial layer was reapproximated with 0 Vicryl suture.  The deep dermal layer was reapproximated with 2-0 Vicryl at all wounds.  The skin was closed with 3-0 Monocryl in all wounds.  All counts were correct at the end of the case.  Dermabond was applied over the skin at all the incision.  Island dressings were placed over the incisions.  The patient was transferred back to a bed and brought to the postanesthesia care unit by anesthesia staff in stable condition.   Post-operative plan: The patient will recover in the post-anesthesia care unit and then go to the floor. The patient will receive two post-operative doses of ancef  and will get another dose of TXA. The patient will be out of bed as tolerated with no brace. The patient will work with physical therapy. The patient will likely discharge to home in the next couple of days.     Ozell Ada, MD Orthopedic Surgeon

## 2024-04-20 NOTE — Evaluation (Signed)
 Occupational Therapy Evaluation Patient Details Name: Brooke Villa MRN: [id number] DOB: 06/14/1946 Today's Date: 04/20/2024   History of Present Illness   Patient is a 78 y.o. female who is currently admitted after undergoing L4/5 XLIF and PSIF. PMH includes DM, GERD, HTN, HLD, L partial knee replacement, cataract surgery.     Clinical Impressions Pt ind at baseline with ADLs, uses [facility] for mobility, lives with spouse who can assist at d/c. Pt currently needs up to min A for ADLs, supervision for bed mobility and CGA for transfers with RW. Pt with mild LOB episode but able to self correct. Pt currently with back and LLE pain, educated on back precautions and compensatory strategies for ADLs/mobility, pt able to verbalize and demo during session. Discussed use of reacher for LB ADLs at home (pt has reacher at home). Pt presenting with impairments listed below, will follow acutely. Anticipate no OT follow up needs at d/c.       If plan is discharge home, recommend the following:   A little help with walking and/or transfers;A little help with bathing/dressing/bathroom;Assistance with cooking/housework;Assist for transportation     Functional Status Assessment   Patient has had a recent decline in their functional status and demonstrates the ability to make significant improvements in function in a reasonable and predictable amount of time.     Equipment Recommendations   Other (comment) (RW)     Recommendations for Other Services   PT consult     Precautions/Restrictions   Precautions Precautions: Back Precaution Booklet Issued: Yes (comment) Recall of Precautions/Restrictions: Intact Precaution/Restrictions Comments: educated on spinal prec Required Braces or Orthoses: Other Brace Other Brace: no brace needed per MD Restrictions Weight Bearing Restrictions Per Provider Order: No     Mobility Bed Mobility Overal bed mobility: Needs Assistance Bed Mobility:  Sidelying to Sit, Rolling Rolling: Supervision Sidelying to sit: Supervision       General bed mobility comments: via log roll    Transfers Overall transfer level: Needs assistance Equipment used: Rolling walker (2 wheels) Transfers: Sit to/from Stand Sit to Stand: Contact guard assist           General transfer comment: mild instability      Balance Overall balance assessment: Mild deficits observed, not formally tested                                         ADL either performed or assessed with clinical judgement   ADL Overall ADL's : Needs assistance/impaired Eating/Feeding: Independent   Grooming: Set up;Standing   Upper Body Bathing: Contact guard assist;Standing   Lower Body Bathing: Minimal assistance;Sit to/from stand   Upper Body Dressing : Contact guard assist   Lower Body Dressing: Minimal assistance   Toilet Transfer: Contact guard assist;Ambulation;Rolling walker (2 wheels);Regular Toilet   Toileting- Clothing Manipulation and Hygiene: Contact guard assist       Functional mobility during ADLs: Contact guard assist;Rolling walker (2 wheels)       Vision   Vision Assessment?: No apparent visual deficits     Perception Perception: Not tested       Praxis Praxis: Not tested       Pertinent Vitals/Pain Pain Assessment Pain Assessment: Faces Pain Score: 3  Faces Pain Scale: Hurts little more Pain Location: LLE Pain Descriptors / Indicators: Discomfort Pain Intervention(s): Limited activity within patient's tolerance, Monitored during session, Repositioned  Extremity/Trunk Assessment Upper Extremity Assessment Upper Extremity Assessment: Generalized weakness   Lower Extremity Assessment Lower Extremity Assessment: Defer to PT evaluation   Cervical / Trunk Assessment Cervical / Trunk Assessment: Back Surgery   Communication Communication Communication: No apparent difficulties   Cognition Arousal:  Alert Behavior During Therapy: WFL for tasks assessed/performed Cognition: No apparent impairments                               Following commands: Intact       Cueing  General Comments   Cueing Techniques: Verbal cues  VSS   Exercises     Shoulder Instructions      Home Living Family/patient expects to be discharged to:: Private residence Living Arrangements: Spouse/significant other Available Help at Discharge: Family;Available 24 hours/day Type of Home: House Home Access: Stairs to enter [employer] of Steps: 2   Home Layout: Two level;Able to live on main level with bedroom/bathroom     Bathroom Shower/Tub: Producer, Television/film/video: Standard Bathroom Accessibility: Yes   Home Equipment: Cane - single point;Shower seat;Adaptive equipment Adaptive Equipment: Reacher        Prior Functioning/Environment Prior Level of Function : Needs assist;Driving             Mobility Comments: SPC at all times ADLs Comments: ind    OT Problem List: Decreased strength;Decreased range of motion;Decreased activity tolerance;Impaired balance (sitting and/or standing)   OT Treatment/Interventions: Self-care/ADL training;Therapeutic exercise;Energy conservation;DME and/or AE instruction;Therapeutic activities;Patient/family education;Balance training      OT Goals(Current goals can be found in the care plan section)   Acute Rehab OT Goals Patient Stated Goal: none stated OT Goal Formulation: With patient Time For Goal Achievement: 05/04/24 Potential to Achieve Goals: Good ADL Goals Pt Will Perform Lower Body Dressing: Independently;sitting/lateral leans;sit to/from stand;with adaptive equipment Pt Will Perform Tub/Shower Transfer: Shower transfer;rolling walker;shower seat;Independently   OT Frequency:  Min 2X/week    Co-evaluation              AM-PAC OT 6 Clicks Daily Activity     Outcome Measure Help from another  person eating meals?: None Help from another person taking care of personal grooming?: A Little Help from another person toileting, which includes using toliet, bedpan, or urinal?: A Little Help from another person bathing (including washing, rinsing, drying)?: A Little Help from another person to put on and taking off regular upper body clothing?: A Little Help from another person to put on and taking off regular lower body clothing?: A Little 6 Click Score: 19   End of Session Equipment Utilized During Treatment: Rolling walker (2 wheels) Nurse Communication: Mobility status  Activity Tolerance: Patient tolerated treatment well Patient left: in bed;with call bell/phone within reach  OT Visit Diagnosis: Unsteadiness on feet (R26.81);Other abnormalities of gait and mobility (R26.89);Muscle weakness (generalized) (M62.81)                Time: 9195-9169 OT Time Calculation (min): 26 min Charges:  OT General Charges $OT Visit: 1 Visit OT Evaluation $OT Eval Low Complexity: 1 Low OT Treatments $Self Care/Home Management : 8-22 mins  Libbie Bartley K, OTD, OTR/L SecureChat Preferred Acute Rehab [phone]   Laneta POUR Koonce 04/20/2024, 8:41 AM

## 2024-04-20 NOTE — Progress Notes (Addendum)
 Orthopedic Surgery Post-operative Progress Note  Assessment: Patient is a 78 y.o. female who is currently admitted after undergoing L4/5 XLIF and PSIF   Plan: -Operative plans complete -Needs upright films when able -Drain: none -Out of bed as tolerated, no brace -No bending/lifting/twisting greater than 10 pounds -PT evaluate and treat -Pain control -Diabetic diet -No chemoprophylaxis for dvt or antiplatelets for 72 hours after surgery -Ancef  x2 post-operative doses -Disposition: remain floor status  Acute anemia from surgical blood loss -hemoglobin of 9.7 -patient asymptomatic and no further blood loss expected so no intervention at this time  ___________________________________________________________________________   Subjective: No acute events overnight. Pain under control with current medications. Walked the halls and did not notice any leg pain. No complaints this morning.   Objective:  General: no acute distress, appropriate affect Neurologic: alert, answering questions appropriately, following commands Respiratory: unlabored breathing on room air Skin: dressing clear/dry/intact  MSK (spine):  -Strength exam      Right  Left  EHL    5/5  5/5 TA    5/5  5/5 GSC    5/5  5/5 Knee extension  5/5  5/5 Hip flexion   5/5  4/5  -Sensory exam    Sensation intact to light touch in L3-S1 nerve distributions of bilateral lower extremities   Yesterday's total administered Morphine Milligram Equivalents: 95   Patient name: Brooke Villa Patient MRN: [id number] Date: 04/20/24

## 2024-04-21 ENCOUNTER — Encounter (HOSPITAL_COMMUNITY): Payer: Self-pay | Admitting: Orthopedic Surgery

## 2024-04-21 ENCOUNTER — Other Ambulatory Visit (HOSPITAL_COMMUNITY): Payer: Self-pay

## 2024-04-21 LAB — GLUCOSE, CAPILLARY: Glucose-Capillary: 85 mg/dL (ref 70–99)

## 2024-04-21 NOTE — Progress Notes (Signed)
 Orthopedic Surgery Post-operative Progress Note  Assessment: Patient is a 78 y.o. female who is currently admitted after undergoing L4/5 XLIF and PSIF   Plan: -Operative plans complete -Drain: none -Out of bed as tolerated, no brace -No bending/lifting/twisting greater than 10 pounds -PT evaluate and treat -Pain control -Diabetic diet -No chemoprophylaxis for dvt or antiplatelets for 72 hours after surgery -Anticipate discharge to home today  ___________________________________________________________________________   Subjective: No acute events overnight. Having pain in her back mostly on the right side. Leg pain has improved since surgery. Still has some thigh pain with sitting. No leg pain when walking.   Objective:  General: no acute distress, appropriate affect Neurologic: alert, answering questions appropriately, following commands Respiratory: unlabored breathing on room air Skin: dressing clear/dry/intact  MSK (spine):  -Strength exam      Right  Left  EHL    5/5  5/5 TA    5/5  5/5 GSC    5/5  5/5 Knee extension  5/5  5/5 Hip flexion   5/5  4/5  -Sensory exam    Sensation intact to light touch in L3-S1 nerve distributions of bilateral lower extremities   Yesterday's total administered Morphine Milligram Equivalents: 37.5   Patient name: Brooke Villa Patient MRN: [id number] Date: 04/21/24

## 2024-04-21 NOTE — Plan of Care (Signed)
 Pt and husband given D/C instructions with verbal understanding. Rx's were picked up from [facility] pharmacy by husband at D/C. Pt's incision is clean and dry with no sign of infection. Pt's IV was removed prior to D/C. Pt D/C'd home via wheelchair per MD order. Pt received RW from [employer] per MD order. Pt is stable @ D/C and has no other needs at this time. Rosina Rakers, RN

## 2024-04-21 NOTE — Progress Notes (Signed)
 Occupational Therapy Treatment Patient Details Name: Brooke Villa MRN: [id number] DOB: 01-Feb-1946 Today's Date: 04/21/2024   History of present illness Patient is a 78 y.o. female who is currently admitted after undergoing L4/5 XLIF and PSIF. PMH includes DM, GERD, HTN, HLD, L partial knee replacement, cataract surgery.   OT comments  Patient demonstrating good gains with OT treatment for bed mobility, transfers, and self care.  AE training performed with sock aide for LB dressing with supervision following demonstration.  Discharge recommendations continue to be appropriate.       If plan is discharge home, recommend the following:  A little help with walking and/or transfers;A little help with bathing/dressing/bathroom;Assistance with cooking/housework;Assist for transportation   Equipment Recommendations  Other (comment) (RW)    Recommendations for Other Services      Precautions / Restrictions Precautions Precautions: Back Precaution Booklet Issued: Yes (comment) Recall of Precautions/Restrictions: Intact Precaution/Restrictions Comments: patient able to recall precautions Other Brace: no brace needed per MD Restrictions Weight Bearing Restrictions Per Provider Order: No       Mobility Bed Mobility Overal bed mobility: Needs Assistance Bed Mobility: Sidelying to Sit, Rolling Rolling: Modified independent (Device/Increase time) Sidelying to sit: Supervision       General bed mobility comments: performed log rolling without cues    Transfers Overall transfer level: Needs assistance Equipment used: Rolling walker (2 wheels) Transfers: Sit to/from Stand Sit to Stand: Supervision           General transfer comment: supervision for safety with toilet and chair transfers     Balance Overall balance assessment: Mild deficits observed, not formally tested                                         ADL either performed or assessed with clinical  judgement   ADL Overall ADL's : Needs assistance/impaired     Grooming: Wash/dry hands;Wash/dry face;Oral care;Brushing hair;Set up;Standing           Upper Body Dressing : Set up;Sitting   Lower Body Dressing: Supervision/safety;Sitting/lateral leans;With adaptive equipment Lower Body Dressing Details (indicate cue type and reason): adaptive equipment training with sock aide and patient able to return demonstration with supervision Toilet Transfer: Supervision/safety;Ambulation;Regular Toilet;Rolling walker (2 wheels)   Toileting- Clothing Manipulation and Hygiene: Modified independent       Functional mobility during ADLs: Supervision/safety;Rolling walker (2 wheels) General ADL Comments: demonstrated good understanding of AE use    Extremity/Trunk Assessment              Vision       Perception     Praxis     Communication Communication Communication: No apparent difficulties   Cognition Arousal: Alert Behavior During Therapy: WFL for tasks assessed/performed Cognition: No apparent impairments                               Following commands: Intact        Cueing   Cueing Techniques: Verbal cues  Exercises      Shoulder Instructions       General Comments VSS    Pertinent Vitals/ Pain       Pain Assessment Pain Assessment: Faces Faces Pain Scale: Hurts a little bit Pain Location: LLE Pain Descriptors / Indicators: Discomfort Pain Intervention(s): Limited activity within patient's tolerance, Monitored during session, Repositioned  Home  Living                                          Prior Functioning/Environment              Frequency  Min 2X/week        Progress Toward Goals  OT Goals(current goals can now be found in the care plan section)  Progress towards OT goals: Progressing toward goals  Acute Rehab OT Goals Patient Stated Goal: to go home OT Goal Formulation: With patient Time For  Goal Achievement: 05/04/24 Potential to Achieve Goals: Good ADL Goals Pt Will Perform Lower Body Dressing: Independently;sitting/lateral leans;sit to/from stand;with adaptive equipment Pt Will Perform Tub/Shower Transfer: Shower transfer;rolling walker;shower seat;Independently  Plan      Co-evaluation                 AM-PAC OT 6 Clicks Daily Activity     Outcome Measure   Help from another person eating meals?: None Help from another person taking care of personal grooming?: A Little Help from another person toileting, which includes using toliet, bedpan, or urinal?: A Little Help from another person bathing (including washing, rinsing, drying)?: A Little Help from another person to put on and taking off regular upper body clothing?: A Little Help from another person to put on and taking off regular lower body clothing?: A Little 6 Click Score: 19    End of Session Equipment Utilized During Treatment: Rolling walker (2 wheels)  OT Visit Diagnosis: Unsteadiness on feet (R26.81);Other abnormalities of gait and mobility (R26.89);Muscle weakness (generalized) (M62.81)   Activity Tolerance Patient tolerated treatment well   Patient Left in chair;with call bell/phone within reach   Nurse Communication Mobility status        Time: 0802-0829 OT Time Calculation (min): 27 min  Charges: OT General Charges $OT Visit: 1 Visit OT Treatments $Self Care/Home Management : 23-37 mins  Dick Laine, OTA Acute Rehabilitation Services  Office [phone]   Jeb LITTIE Laine 04/21/2024, 8:50 AM

## 2024-05-02 ENCOUNTER — Other Ambulatory Visit

## 2024-05-02 ENCOUNTER — Ambulatory Visit (INDEPENDENT_AMBULATORY_CARE_PROVIDER_SITE_OTHER): Admitting: Orthopedic Surgery

## 2024-05-02 DIAGNOSIS — Z981 Arthrodesis status: Secondary | ICD-10-CM

## 2024-05-02 NOTE — Progress Notes (Signed)
 Orthopedic Surgery Post-operative Office Visit  Procedure: L4/5 XLIF and PSIF Date of Surgery: 04/19/2024 (~2 weeks post-op)  Assessment: Patient is a 78 y.o. who is doing well after surgery   Plan: -Operative plans complete -Out of bed as tolerated, no brace -Okay to let soap/water run over her incisions but do not submerge -Her leg pain has improved since surgery since this was an indirect decompression, will continue to monitor for further improvement -No bending/lifting/twisting greater than 10 pounds -Pain management: tylenol  as needed -Return to office in 4 weeks, x-rays needed at next visit: AP/lateral lumbar  ___________________________________________________________________________   Subjective: Patient has been at home since discharge from the hospital.  She has noticed improvement in her pain around the incisions over the 2 weeks since surgery.  She said she is just using Tylenol  for pain control.  She has not noticed any redness or drainage around her incisions.  Her leg pain has improved since surgery but she still but has some leg pain.  Objective:  General: no acute distress, appropriate affect Neurologic: alert, answering questions appropriately, following commands Respiratory: unlabored breathing on room air Skin: incisions are well-approximated with no erythema, induration, active/expressible drainage  MSK (spine):  -Strength exam      Left  Right  EHL    5/5  5/5 TA    5/5  5/5 GSC    5/5  5/5 Knee extension  4+/5  5/5 Hip flexion   4+/5  5/5  -Sensory exam    Sensation intact to light touch in L3-S1 nerve distributions of bilateral lower extremities  Imaging: XRs of the lumbar spine from 05/02/2024 were independently reviewed and interpreted, showing posterior instrumentation at L4 and L5.  No lucency seen around the screws.  There is an interbody device in the L4/5 disc space.  Interbody appears in appropriate position.  There is disc height loss at  L3/4.  No fracture or dislocation seen.   Patient name: Brooke Villa Patient MRN: [id number] Date of visit: 05/02/24

## 2024-05-16 ENCOUNTER — Encounter: Payer: Self-pay | Admitting: Orthopedic Surgery

## 2024-05-30 ENCOUNTER — Ambulatory Visit: Admitting: Orthopedic Surgery

## 2024-05-30 ENCOUNTER — Other Ambulatory Visit

## 2024-05-30 DIAGNOSIS — Z981 Arthrodesis status: Secondary | ICD-10-CM

## 2024-05-30 NOTE — Progress Notes (Signed)
 Orthopedic Surgery Post-operative Office Visit   Procedure: L4/5 XLIF and PSIF Date of Surgery: 04/19/2024 (~6 weeks post-op)   Assessment: Patient is a 78 y.o. who is doing well after surgery. Has temporary back pain after changing position.  No radicular symptoms     Plan: -Operative plans complete -Out of bed as tolerated, no brace -Okay to submerge wounds or put lotion on them at this point -Leg pain has now resolved, still with some back pain when she is sitting for a long time or laying down for a long time and then gets up.  Recommended Tylenol  and lidocaine  patches.  Can use heat for Singh in the morning -No bending/lifting/twisting greater than 10 pounds -Return to office in 6 weeks, x-rays needed at next visit: AP/lateral/flex/ex lumbar   ___________________________________________________________________________     Subjective: Patient is not having any radiating leg pain at this point.  She is not having any consistent back pain.  She has noticed back pain just below her incisions when she goes from sitting or laying to standing.  She only notes this pain if she has been sitting or laying down for a prolonged period of time.  If she is walking or standing she does not have any back pain.  She has not noticed any redness or drainage around her incisions.  She is pleased with how she is doing from surgery so far.   Objective:   General: no acute distress, appropriate affect Neurologic: alert, answering questions appropriately, following commands Respiratory: unlabored breathing on room air Skin: incisions are well healed with no erythema, induration, active/expressible drainage   MSK (spine):   -Strength exam                                                   Left                  Right   EHL                              5/5                  5/5 TA                                 5/5                  5/5 GSC                             5/5                  5/5 Knee  extension            4+/5                5/5 Hip flexion                    4+/5                5/5   -Sensory exam  Sensation intact to light touch in L3-S1 nerve distributions of bilateral lower extremities   Imaging: XRs of the lumbar spine from 05/30/2024 were independently reviewed and interpreted, showing interbody device at L4/5 that appears in appropriate position. Instrumentation at L4 and L5 with no lucency around the screws. None of the screws have backed out. Disc heigh loss asymmetrically and lateral listhesis at L3/4. No fracture or dislocation seen.      Patient name: Brooke Villa Patient MRN: [id number] Date of visit: 05/30/2024

## 2024-06-23 ENCOUNTER — Encounter: Payer: Self-pay | Admitting: Orthopedic Surgery

## 2024-06-23 MED ORDER — TRAMADOL HCL 50 MG PO TABS: 50.0000 mg | ORAL_TABLET | Freq: Four times a day (QID) | ORAL | 0 refills | Status: AC | PRN

## 2024-07-11 ENCOUNTER — Ambulatory Visit: Admitting: Orthopedic Surgery

## 2024-07-18 ENCOUNTER — Ambulatory Visit: Admitting: Orthopedic Surgery

## 2024-07-21 ENCOUNTER — Ambulatory Visit: Admitting: Orthopedic Surgery

## 2024-07-21 ENCOUNTER — Other Ambulatory Visit: Payer: Self-pay

## 2024-07-21 DIAGNOSIS — Z981 Arthrodesis status: Secondary | ICD-10-CM

## 2024-07-21 DIAGNOSIS — G8929 Other chronic pain: Secondary | ICD-10-CM

## 2024-07-21 NOTE — Progress Notes (Signed)
 Orthopedic Surgery Post-operative Office Visit   Procedure: L4/5 XLIF and PSIF Date of Surgery: 04/19/2024 (~3 months post-op)   Assessment: Patient is a 79 y.o. who is still having pain in her lower back caudal to the L4/5 level when changing position.  No radicular leg pain. Some of her pain seems to be coming from SI joints based on exam today     Plan: -Operative plans complete -Out of bed as tolerated, no brace -No spine specific restrictions, return to full activity as tolerated -Recommended PT, referral provided to her today -Talked about injections as an additional nonoperative treatment try if PT does not help -Will want to repeat hip/SI exam at next visit -Return to office in 3 months, x-rays needed at next visit: AP/lateral/flex/ex lumbar   ___________________________________________________________________________     Subjective: Patient continues to have back pain caudal to her incisions on both sides when she changes position.  This can be changing position from laying to sitting or sitting to standing.  She notes the pain for brief period time and then it resolves.  She has no radiating leg pain.  She had tried Tylenol  and tramadol  for this pain but it did not work well because the pain is transient so it is hard to anticipate and by the time the medication takes effect pain has gone away.   Objective:   General: no acute distress, appropriate affect Neurologic: alert, answering questions appropriately, following commands Respiratory: unlabored breathing on room air Skin: incisions are well healed   MSK (spine):   -Strength exam                                                   Left                  Right   EHL                              5/5                  5/5 TA                                 5/5                  5/5 GSC                             5/5                  5/5 Knee extension            4+/5                5/5 Hip flexion                    4+/5                 5/5   -Sensory exam                           Sensation intact to light touch in L3-S1 nerve distributions of bilateral lower extremities  Bilateral hip exam: No pain through range of motion, negative FADIR, positive FABER, positive SI joint compression test, positive Gaenslen's, tender over the SI joints bilaterally   Imaging: XRs of the lumbar spine from 07/21/2024 were independently reviewed and interpreted, showing interbody device in the L4/5 disc space.  Interbody appears appropriate position.  No lucency seen around the interbody device.  There is posterior instrumentation at L4 and L5.  No lucency seen around the screws.  Disc height loss with lateral listhesis at L3/4.  No fracture or dislocation seen.  No evidence of instability on flexion/tension views.     Patient name: Brooke Villa Patient MRN: [id number] Date of visit: 07/21/24

## 2024-08-08 ENCOUNTER — Ambulatory Visit: Admitting: Physical Therapy

## 2024-08-31 ENCOUNTER — Ambulatory Visit: Admitting: Orthopedic Surgery

## 2024-10-19 ENCOUNTER — Ambulatory Visit: Admitting: Orthopedic Surgery

## 2024-11-04 ENCOUNTER — Ambulatory Visit: Admitting: Diagnostic Neuroimaging
